# Patient Record
Sex: Female | Born: 1985 | Race: White | Hispanic: No | Marital: Single | Smoking: Former smoker
Health system: Southern US, Community
[De-identification: ages and names within clinical notes are randomized; demographics above are authoritative.]

## PROBLEM LIST (undated history)

## (undated) DIAGNOSIS — Q600 Renal agenesis, unilateral: Secondary | ICD-10-CM

## (undated) DIAGNOSIS — Q761 Klippel-Feil syndrome: Secondary | ICD-10-CM

## (undated) DIAGNOSIS — I519 Heart disease, unspecified: Secondary | ICD-10-CM

## (undated) DIAGNOSIS — O99419 Diseases of the circulatory system complicating pregnancy, unspecified trimester: Secondary | ICD-10-CM

## (undated) HISTORY — PX: NO PAST SURGERIES: SHX2092

---

## 2013-02-25 ENCOUNTER — Encounter (HOSPITAL_COMMUNITY): Payer: Self-pay | Admitting: Specialist

## 2013-03-09 ENCOUNTER — Ambulatory Visit (HOSPITAL_COMMUNITY): Payer: Self-pay

## 2013-03-16 ENCOUNTER — Ambulatory Visit (HOSPITAL_COMMUNITY)
Admission: RE | Admit: 2013-03-16 | Discharge: 2013-03-16 | Disposition: A | Discharge status: Home / Self Care | Payer: Medicaid Other | Source: Ambulatory Visit | Attending: Specialist | Admitting: Specialist

## 2013-03-16 ENCOUNTER — Encounter (HOSPITAL_COMMUNITY): Payer: Self-pay

## 2013-03-16 DIAGNOSIS — Q761 Klippel-Feil syndrome: Secondary | ICD-10-CM | POA: Insufficient documentation

## 2013-03-16 DIAGNOSIS — Q602 Renal agenesis, unspecified: Secondary | ICD-10-CM | POA: Insufficient documentation

## 2013-03-16 DIAGNOSIS — O352XX1 Maternal care for (suspected) hereditary disease in fetus, fetus 1: Secondary | ICD-10-CM

## 2013-03-16 DIAGNOSIS — Z981 Arthrodesis status: Secondary | ICD-10-CM | POA: Insufficient documentation

## 2013-03-16 DIAGNOSIS — O99891 Other specified diseases and conditions complicating pregnancy: Secondary | ICD-10-CM | POA: Insufficient documentation

## 2013-03-16 DIAGNOSIS — Q600 Renal agenesis, unilateral: Secondary | ICD-10-CM | POA: Insufficient documentation

## 2013-03-16 HISTORY — DX: Klippel-Feil syndrome: Q76.1

## 2013-03-16 HISTORY — DX: Renal agenesis, unilateral: Q60.0

## 2013-03-16 NOTE — Progress Notes (Addendum)
Genetic Counseling  High-Risk Gestation Note  Appointment Date:  03/16/2013 Referred By: Eliseo Squires, MD Date of Birth:  1985-12-06 Partner:  Andreas Blower   Pregnancy History: [id number] Estimated Date of Delivery: 09/19/13 Estimated Gestational Age: [redacted]w[redacted]d Attending: Rema Fendt, MD  Ms. Nicole Conrad was seen for genetic counseling because of a personal history of Klippel-Feil syndrome.    Ms. Nicole Conrad reported that she was diagnosed with Klippel-Feil syndrome during early childhood.  She is not certain who diagnosed her, but believes that it was a pediatrician in [place].  Ms. Nicole Conrad reported that she has never been evaluated by a medical geneticist and has never had genetic testing.  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  She also has congenital absence of a kidney. The contralateral kidney is normal.  Otherwise, she has no significant health or medical concerns.  Medical records were not available to verify the reported history.  Ms. Nicole Conrad was counseled that the risk assessment provided today is based on the reported history.  The patient was encouraged to contact me if she learns additional information regarding her condition.    Both family histories were reviewed and found to be otherwise noncontributory for birth defects, intellectual disability, and known genetic conditions. Without further information regarding the provided family history, an accurate genetic risk cannot be calculated. Further genetic counseling is warranted if more information is obtained.  Ms. Nicole Conrad was counseled that Klippel-Feil syndrome occurs in approximately 1 in 40,000 births and is characterized by the abnormal fusion of two or more bones in the neck.  Individuals with Klippel-Feil syndrome may have a short, webbed neck, decreased range of motion in the head and neck area, and/or a low hairline at the  back of the head.  Additionally, other features can be observed, including: facial asymmetry, hearing loss, cleft palate, a Sprengel anomaly (upward displacement of the scapula), and fusion in the thoracic spine causing scoliosis and/or kyphosis.  We discussed that Klippel-Feil syndrome varies in severity, with some individuals having many features and others having relatively few.  She was counseled that Klippel-Feil syndrome is most often a dominant condition, caused by a single gene alteration that can be inherited or occur de novo. We reviewed that Klippel-Feil syndrome less commonly occurs due to autosomal recessive inheritance (Klippel-Feil syndrome type 2).  Klippel-Feil syndrome shows genetic heterogeneity; to date, at least 2-3 genes, when altered, are known to cause Klippel-Feil syndrome.  Additionally, we discussed that Klippel-Feil anomaly (characterized by abnormal fusion of cervical vertebrae) can occur as a feature of another syndrome.  We reviewed autosomal dominant inheritance, where offspring of an affected individual have a 1 in 2 (50%) chance to inherit the condition. We also reviewed autosomal recessive inheritance, where offspring of an affected individual have a 1 in 4 (25%) chance to inherit the condition.  If Ms. Nicole Conrad has autosomal recessive Klippel-Feil syndrome (type 2), the risk of recurrence is expected to be low.  We discussed that all of her children would be expected to be carriers of the condition, but that the father of the baby would also have to be a carrier of the condition (same gene, same type) in order for the fetus to have an increased risk to inherit the condition.  If however, Ms. Nicole Conrad has autosomal dominant Klippel-Feil syndrome, the risk of recurrence is expected to be 50%.  She was counseled that if she  has autosomal dominant Klippel-Feil syndrome, she likely has a de novo gene change, given that neither of her parents have apparent clinical features of  Klippel-Feil syndrome.    She was counseled that prenatal genetic testing for the current pregnancy is not warranted at this time, considering that Ms. Nicole Conrad has never had genetic testing and a specific gene cause for her Klippel-Feil syndrome is not known.  We discussed the option of genetic testing for Ms. Nicole Conrad and/or a consultation with a medical geneticist.  She is not interested in these options at this time.  In addition, we discussed the availability of a detailed ultrasound at 18+ weeks.  She understands that ultrasound may detect severe fusion and shortening of the fetal neck; however mild cases may not be detectable by ultrasound.  A normal appearing fetal neck by ultrasound would be reassuring, but would not eliminate the possibility of Klippel-Feil syndrome in the fetus.  Ms. Nicole Conrad expressed interest in returning for a detailed ultrasound.  This appointment was scheduled today. She reported that she had a nuchal translucency ultrasound, which was wnl.  We again reviewed the limitations of ultrasound, especially in the first trimester.  Ms. Nicole Conrad was provided with written information regarding cystic fibrosis (CF) including the carrier frequency and incidence in the Caucasian population, the availability of carrier testing and prenatal diagnosis if indicated.  In addition, we discussed that CF is routinely screened for as part of the [place] newborn screening panel.  She declined testing today.   Ms. Nicole Conrad denied exposure to environmental toxins or chemical agents. She denied the use of alcohol, tobacco or street drugs. She denied significant viral illnesses during the course of her pregnancy. She has a complex pregnancy history, which was reviewed today during a maternal fetal medicine consult with Dr. Rema Fendt.  Please refer to Dr. Baltazar Apo note for specific pregnancy management recommendations.   I counseled Ms. Ms. Nicole Conrad regarding the above risks and available options.  The  approximate face-to-face time with the genetic counselor was 42 minutes.  Despina Arias, MS  Certified Genetic Counselor

## 2013-03-16 NOTE — Progress Notes (Signed)
MATERNAL FETAL MEDICINE CONSULT  Patient Name: Nicole Conrad Medical Record Number:  [id number] Date of Birth: 21-Jul-1985 Requesting Physician Name:  Eliseo Squires, MD Date of Service: 03/16/2013  Chief Complaint Maternal Klippel-Feil syndrome and poor obstetric history  History of Present Illness Nicole Conrad was seen today secondary to maternal Klippel-Feil syndrome and poor obstetric history at the request of Eliseo Squires, MD.  The patient is a 27 y.o. G4P0301 at 13 weeks 1 day by LMP who has Klippel-Feil syndrome.  She reports she has several fused cervical vertebrae, but is uncertain exam how many or which vertebrae.  She does not report any scoliosis.  Her main issue she has with regard to her skeletal malformations is intermittent sharp pains in her upper chest and shoulders.  She also has a congenital solitary kidney but normal overall renal function.  She has not cardiopulmonary issues.  She has not had any orthopedic surgery and does not have a regular doctor that monitors this issue.  I do not have medical records outlining the specifics of Nicole Conrad's skeletal malformations to confirm her account.  She suffered an IUFD at approximately 24 weeks due to what Nicole Conrad reports was an umbilical cord clot and subsequent underwent and induction of labor and had a vaginal delivery.  In her second pregnancy she delivered via NSVD at 36 weeks and had no complications other than the late preterm birth.  In her third pregnancy she was pregnant with twins and experienced PPROM at approximately 20 weeks and subsequently labored and had vaginal deliveries of both twins.  Nicole Conrad reports that no one else in her family has Klippel-Feil syndrome, including her living son and her second trimester losses.  Review of Systems Pertinent items are noted in HPI.  Patient History OB History  Gravida Para Term Preterm AB SAB TAB Ectopic Multiple Living  4 2  2 1          # Outcome Date GA Lbr Len/2nd Weight Sex  Delivery Anes PTL Lv  4 CUR           3 ABT  [redacted]w[redacted]d            Comments: PPROM with twins  2 PRE  [redacted]w[redacted]d    SVD     1 PRE  [redacted]w[redacted]d    SVD   SB     Comments: IUFD      Past Medical History  Diagnosis Date  . Klippel-Feil syndrome     Cervical spine fusion; scapula abnormality  . Solitary kidney, congenital   . Preterm labor     Past Surgical History  Procedure Laterality Date  . No past surgeries      History   Social History  . Marital Status: Single    Spouse Name: N/A    Number of Children: N/A  . Years of Education: N/A   Social History Main Topics  . Smoking status: Former Smoker -- 0.25 packs/day    Quit date: 03/09/2013  . Smokeless tobacco: None  . Alcohol Use: No  . Drug Use: No  . Sexual Activity: Yes    Birth Control/ Protection: None   Other Topics Concern  . None   Social History Narrative  . None    Family History Ms. Nicole Conrad has no family history of mental retardation, birth defects, or genetic diseases.   Assessment and Recommendations 1.  Maternal Klippel-Feil Syndrome.  This is a genetic condition that has several reported inheritance patterns.  After discussing the  matter with our genetic counselor, Despina Arias, [facility], Nicole Conrad likely has a new dominant mutation which would carry a 50% recurrence risk in her offspring.  For more details please see Nicole Conrad's note.  Nicole Conrad has opted not to purse genetic testing for Klippel-Feil syndrome either for herself or her fetus at this time.  She will return in 5 weeks for a detailed fetal anatomic survey, but understands that ultrasound has limited sensitivity at detecting Klippel-Feil syndrome in-utero.  Other than this recurrence risk the Klippel-Feil syndrome should have limited impact on her pregnancy as Nicole Conrad has very limited ongoing symptoms as a result of her skeletal malformations.  It is reassuring that she had no significant issues in her one successful pregnancy that she carried to  36 weeks.  As such I do not expect there to be any issues in this pregnancy.  People with Klippel-Feil sometimes have restrictive lung disease due to ribcage malformations, which can be worsened in pregnancy as the gravid uterus grows and displaces the diaphragm superiorly.  I have ordered a set of pulmonary function tests to determine Nicole Conrad's lung capacity. 2.  Poor obstetrical history.  Nicole Conrad has had two midtrimester pregnancy losses.  The first was reportedly due to an umbilical cord clot, but I do not have records available to confirm that.  She also experienced PPROM with twins in her third pregnancy.  I have ordered a thrombophilia work-up to investigate that as a possible etiology of her recurrent pregnancy losses.  However, her history is not typical for that diagnosis.  I would like to review the placental pathology report from this pregnancy to determine what if any thrombosis or infarction was present.  If the pathology was normal and the thrombophilia workup positive Nicole Conrad should receive prophylactic anticoagulation with either unfractionated heparin or low-molecular weight heparin.  A daily baby aspirin is also indicated if she has anti-phospholipid syndrome.  If the pathology report indications thrombosis or infarction Nicole Conrad should receive prophylactic heparin regardless of the thrombophilia results.  If the pathology was normal and no thrombophilia is present no anticoagulation is required.  We would be happy to see Nicole Conrad back in the [facility] to clarify the need for anti-coagulation once the blood tests have been completed and placental pathology reports obtained.  I spent 30 minutes with Nicole Conrad today of which 50% was face-to-face counseling.  Thank you for referring Nicole Conrad to the [facility].  Please do not hesitate to contact us with questions.   Rema Fendt, MD

## 2013-03-16 NOTE — ED Notes (Signed)
BP-128/72, P-93, Wt.-163lb

## 2013-03-17 LAB — FACTOR 5 LEIDEN

## 2013-03-17 LAB — LUPUS ANTICOAGULANT PANEL
Lupus Anticoagulant: NOT DETECTED
dRVVT Incubated 1:1 Mix: 36.5 secs (ref <42.9)

## 2013-03-17 LAB — BETA-2-GLYCOPROTEIN I ABS, IGG/M/A
Beta-2 Glyco I IgG: 0 G Units (ref <20)
Beta-2-Glycoprotein I IgA: 0 A Units (ref <20)

## 2013-03-17 LAB — CARDIOLIPIN ANTIBODIES, IGG, IGM, IGA: Anticardiolipin IgM: 29 MPL U/mL — ABNORMAL HIGH (ref <11)

## 2013-03-17 NOTE — Addendum Note (Signed)
Encounter addended by: Ty Hilts, RN on: 03/17/2013  8:39 AM<BR>     Documentation filed: Charges VN

## 2013-03-23 ENCOUNTER — Encounter (HOSPITAL_COMMUNITY): Payer: Medicaid Other

## 2013-04-20 ENCOUNTER — Other Ambulatory Visit (HOSPITAL_COMMUNITY): Payer: Self-pay | Admitting: Maternal and Fetal Medicine

## 2013-04-20 ENCOUNTER — Encounter (HOSPITAL_COMMUNITY): Payer: Self-pay

## 2013-04-20 ENCOUNTER — Ambulatory Visit (HOSPITAL_COMMUNITY)
Admission: RE | Admit: 2013-04-20 | Discharge: 2013-04-20 | Disposition: A | Discharge status: Home / Self Care | Payer: Medicaid Other | Source: Ambulatory Visit | Attending: Specialist | Admitting: Specialist

## 2013-04-20 DIAGNOSIS — O09299 Supervision of pregnancy with other poor reproductive or obstetric history, unspecified trimester: Secondary | ICD-10-CM | POA: Insufficient documentation

## 2013-04-20 DIAGNOSIS — O352XX0 Maternal care for (suspected) hereditary disease in fetus, not applicable or unspecified: Secondary | ICD-10-CM | POA: Insufficient documentation

## 2013-04-20 DIAGNOSIS — O352XX1 Maternal care for (suspected) hereditary disease in fetus, fetus 1: Secondary | ICD-10-CM

## 2013-04-20 DIAGNOSIS — O358XX0 Maternal care for other (suspected) fetal abnormality and damage, not applicable or unspecified: Secondary | ICD-10-CM | POA: Insufficient documentation

## 2013-04-20 DIAGNOSIS — Z8751 Personal history of pre-term labor: Secondary | ICD-10-CM | POA: Insufficient documentation

## 2013-04-20 DIAGNOSIS — Z1389 Encounter for screening for other disorder: Secondary | ICD-10-CM | POA: Insufficient documentation

## 2013-04-20 DIAGNOSIS — Z363 Encounter for antenatal screening for malformations: Secondary | ICD-10-CM | POA: Insufficient documentation

## 2014-02-23 ENCOUNTER — Encounter (HOSPITAL_COMMUNITY): Payer: Self-pay | Admitting: *Deleted

## 2014-04-06 ENCOUNTER — Emergency Department (HOSPITAL_COMMUNITY): Payer: Medicaid Other

## 2014-04-06 ENCOUNTER — Inpatient Hospital Stay (HOSPITAL_COMMUNITY)
Admission: EM | Admit: 2014-04-06 | Discharge: 2014-04-08 | DRG: 781 | Discharge status: Against Medical Advice | Payer: Medicaid Other | Attending: Cardiology | Admitting: Cardiology

## 2014-04-06 ENCOUNTER — Encounter (HOSPITAL_COMMUNITY): Payer: Self-pay | Admitting: Emergency Medicine

## 2014-04-06 DIAGNOSIS — R079 Chest pain, unspecified: Secondary | ICD-10-CM | POA: Diagnosis not present

## 2014-04-06 DIAGNOSIS — I379 Nonrheumatic pulmonary valve disorder, unspecified: Secondary | ICD-10-CM

## 2014-04-06 DIAGNOSIS — Z8249 Family history of ischemic heart disease and other diseases of the circulatory system: Secondary | ICD-10-CM | POA: Diagnosis not present

## 2014-04-06 DIAGNOSIS — Q600 Renal agenesis, unilateral: Secondary | ICD-10-CM | POA: Diagnosis not present

## 2014-04-06 DIAGNOSIS — Z3A17 17 weeks gestation of pregnancy: Secondary | ICD-10-CM | POA: Diagnosis present

## 2014-04-06 DIAGNOSIS — I214 Non-ST elevation (NSTEMI) myocardial infarction: Secondary | ICD-10-CM | POA: Diagnosis present

## 2014-04-06 DIAGNOSIS — E876 Hypokalemia: Secondary | ICD-10-CM | POA: Diagnosis present

## 2014-04-06 DIAGNOSIS — Z3A18 18 weeks gestation of pregnancy: Secondary | ICD-10-CM | POA: Diagnosis not present

## 2014-04-06 DIAGNOSIS — Q761 Klippel-Feil syndrome: Secondary | ICD-10-CM | POA: Diagnosis not present

## 2014-04-06 DIAGNOSIS — O2622 Pregnancy care for patient with recurrent pregnancy loss, second trimester: Secondary | ICD-10-CM | POA: Diagnosis present

## 2014-04-06 DIAGNOSIS — O99112 Other diseases of the blood and blood-forming organs and certain disorders involving the immune mechanism complicating pregnancy, second trimester: Secondary | ICD-10-CM | POA: Diagnosis not present

## 2014-04-06 DIAGNOSIS — I2542 Coronary artery dissection: Secondary | ICD-10-CM | POA: Diagnosis present

## 2014-04-06 DIAGNOSIS — O09292 Supervision of pregnancy with other poor reproductive or obstetric history, second trimester: Secondary | ICD-10-CM

## 2014-04-06 DIAGNOSIS — O99419 Diseases of the circulatory system complicating pregnancy, unspecified trimester: Secondary | ICD-10-CM

## 2014-04-06 DIAGNOSIS — O09299 Supervision of pregnancy with other poor reproductive or obstetric history, unspecified trimester: Secondary | ICD-10-CM | POA: Insufficient documentation

## 2014-04-06 DIAGNOSIS — O228X2 Other venous complications in pregnancy, second trimester: Secondary | ICD-10-CM | POA: Diagnosis not present

## 2014-04-06 DIAGNOSIS — D6861 Antiphospholipid syndrome: Secondary | ICD-10-CM | POA: Diagnosis not present

## 2014-04-06 DIAGNOSIS — Z87891 Personal history of nicotine dependence: Secondary | ICD-10-CM | POA: Diagnosis not present

## 2014-04-06 DIAGNOSIS — O26892 Other specified pregnancy related conditions, second trimester: Principal | ICD-10-CM | POA: Diagnosis present

## 2014-04-06 DIAGNOSIS — I519 Heart disease, unspecified: Secondary | ICD-10-CM

## 2014-04-06 HISTORY — DX: Diseases of the circulatory system complicating pregnancy, unspecified trimester: O99.419

## 2014-04-06 HISTORY — DX: Heart disease, unspecified: I51.9

## 2014-04-06 LAB — I-STAT CHEM 8, ED
BUN: 8 mg/dL (ref 6–23)
CALCIUM ION: 1.1 mmol/L — AB (ref 1.12–1.23)
Chloride: 105 mEq/L (ref 96–112)
Creatinine, Ser: 0.5 mg/dL (ref 0.50–1.10)
Glucose, Bld: 96 mg/dL (ref 70–99)
HEMATOCRIT: 35 % — AB (ref 36.0–46.0)
HEMOGLOBIN: 11.9 g/dL — AB (ref 12.0–15.0)
Potassium: 3.6 mEq/L — ABNORMAL LOW (ref 3.7–5.3)
SODIUM: 137 meq/L (ref 137–147)
TCO2: 19 mmol/L (ref 0–100)

## 2014-04-06 LAB — TROPONIN I
TROPONIN I: 12.27 ng/mL — AB (ref <0.30)
Troponin I: >20 ng/mL (ref <0.30)
Troponin I: >20 ng/mL (ref <0.30)

## 2014-04-06 LAB — CBC
HCT: 33 % — ABNORMAL LOW (ref 36.0–46.0)
Hemoglobin: 11.2 g/dL — ABNORMAL LOW (ref 12.0–15.0)
MCH: 31.2 pg (ref 26.0–34.0)
MCHC: 33.9 g/dL (ref 30.0–36.0)
MCV: 91.9 fL (ref 78.0–100.0)
PLATELETS: 181 10*3/uL (ref 150–400)
RBC: 3.59 MIL/uL — AB (ref 3.87–5.11)
RDW: 13.6 % (ref 11.5–15.5)
WBC: 15.7 10*3/uL — ABNORMAL HIGH (ref 4.0–10.5)

## 2014-04-06 LAB — COMPREHENSIVE METABOLIC PANEL
ALK PHOS: 80 U/L (ref 39–117)
ALT: 11 U/L (ref 0–35)
ANION GAP: 13 (ref 5–15)
AST: 52 U/L — ABNORMAL HIGH (ref 0–37)
Albumin: 2.8 g/dL — ABNORMAL LOW (ref 3.5–5.2)
BILIRUBIN TOTAL: 0.2 mg/dL — AB (ref 0.3–1.2)
BUN: 9 mg/dL (ref 6–23)
CO2: 20 meq/L (ref 19–32)
Calcium: 8.5 mg/dL (ref 8.4–10.5)
Chloride: 103 mEq/L (ref 96–112)
Creatinine, Ser: 0.53 mg/dL (ref 0.50–1.10)
GLUCOSE: 93 mg/dL (ref 70–99)
POTASSIUM: 3.8 meq/L (ref 3.7–5.3)
Sodium: 136 mEq/L — ABNORMAL LOW (ref 137–147)
TOTAL PROTEIN: 6.3 g/dL (ref 6.0–8.3)

## 2014-04-06 LAB — RAPID URINE DRUG SCREEN, HOSP PERFORMED
Amphetamines: NOT DETECTED
Barbiturates: NOT DETECTED
Benzodiazepines: NOT DETECTED
Cocaine: NOT DETECTED
OPIATES: NOT DETECTED
Tetrahydrocannabinol: NOT DETECTED

## 2014-04-06 LAB — TSH: TSH: 0.64 u[IU]/mL (ref 0.350–4.500)

## 2014-04-06 LAB — MRSA PCR SCREENING: MRSA by PCR: NEGATIVE

## 2014-04-06 LAB — I-STAT TROPONIN, ED: TROPONIN I, POC: 6.23 ng/mL — AB (ref 0.00–0.08)

## 2014-04-06 LAB — PRO B NATRIURETIC PEPTIDE: Pro B Natriuretic peptide (BNP): 181 pg/mL — ABNORMAL HIGH (ref 0–125)

## 2014-04-06 MED ORDER — NITROGLYCERIN 2 % TD OINT
1.0000 [in_us] | TOPICAL_OINTMENT | Freq: Four times a day (QID) | TRANSDERMAL | Status: DC
  Filled 2014-04-06: qty 30

## 2014-04-06 MED ORDER — ACETAMINOPHEN 325 MG PO TABS
650.0000 mg | ORAL_TABLET | ORAL | Status: DC | PRN
  Administered 2014-04-06 – 2014-04-08 (×5): 650 mg via ORAL
  Filled 2014-04-06 (×5): qty 2

## 2014-04-06 MED ORDER — METOPROLOL TARTRATE 12.5 MG HALF TABLET
12.5000 mg | ORAL_TABLET | Freq: Two times a day (BID) | ORAL | Status: DC
  Administered 2014-04-06 (×2): 12.5 mg via ORAL
  Filled 2014-04-06 (×4): qty 1

## 2014-04-06 MED ORDER — NITROGLYCERIN 0.4 MG SL SUBL
0.4000 mg | SUBLINGUAL_TABLET | SUBLINGUAL | Status: DC | PRN
  Administered 2014-04-07: 0.4 mg via SUBLINGUAL
  Filled 2014-04-06: qty 1

## 2014-04-06 MED ORDER — ASPIRIN EC 81 MG PO TBEC
81.0000 mg | DELAYED_RELEASE_TABLET | Freq: Every day | ORAL | Status: DC
  Administered 2014-04-07 – 2014-04-08 (×2): 81 mg via ORAL
  Filled 2014-04-06 (×2): qty 1

## 2014-04-06 MED ORDER — ONDANSETRON HCL 4 MG/2ML IJ SOLN: 4.0000 mg | Freq: Four times a day (QID) | INTRAMUSCULAR | Status: DC | PRN

## 2014-04-06 MED ORDER — POTASSIUM CHLORIDE CRYS ER 20 MEQ PO TBCR
40.0000 meq | EXTENDED_RELEASE_TABLET | Freq: Once | ORAL | Status: AC
  Administered 2014-04-06: 40 meq via ORAL
  Filled 2014-04-06: qty 2

## 2014-04-06 NOTE — ED Notes (Signed)
Pt is transferred [facility], pt's bloodwork showed an elevated troponin at 1.02. Pt was adm 4,0000 units of heparin, 1 inch nitroglycerin paste was applied to pt's central chest, pt also received 650 mg of tylenol. Pt denies chest pain upon arrival to department. Pt is A&O X4. Pt is [redacted] weeks pregnant. Pt denies complications with her pregnancy thus far.

## 2014-04-06 NOTE — Progress Notes (Signed)
  Echocardiogram 2D Echocardiogram has been performed.  Dyke Weible FRANCES 04/06/2014, 11:38 AM

## 2014-04-06 NOTE — ED Notes (Signed)
Pt is a transfer from [facility] for elevated troponin - pt admits to acute onset of sharp chest pain that began yesterday evening, admits to some mild intermittent dyspnea - pt denies n/v, diaphoresis, fever, cough or recent illness. Pt admits she is currently [redacted] wks pregnant as well.

## 2014-04-06 NOTE — ED Provider Notes (Addendum)
CSN: [id number]     Arrival date & time 04/06/14  4098 History   First MD Initiated Contact with Patient 04/06/14 0355     Chief Complaint  Patient presents with  . Chest Pain     (Consider location/radiation/quality/duration/timing/severity/associated sxs/prior Treatment) HPI Nicole Conrad is a 28 y.o. female with who is [redacted] weeks pregnant transferred from [facility] for chest pain. Patient states she had pain in her mid chest around 8 PM while preparing dinner for her children. This pain was sharp and pressure. It radiated to her left arm. She admits to shortness of breath as well. She had nausea, but no emesis or diaphoresis. She states her pain was worse with a deep breath. She was evaluated at [facility], there she had a troponin level of 1.0. Cardiology was consult it here who recommended patient be transferred for further evaluation. She was given nitro paste, and bolused 4000 units of heparin prior to transfer. Patient currently states her chest pain is much improved but still present. She's denying shortness of breath currently. She also states the left hand is still painful. Patient has no further complaints.  10 Systems reviewed and are negative for acute change except as noted in the HPI.     Past Medical History  Diagnosis Date  . Klippel-Feil syndrome     Cervical spine fusion; scapula abnormality  . Solitary kidney, congenital   . Preterm labor    Past Surgical History  Procedure Laterality Date  . No past surgeries     Family History  Problem Relation Age of Onset  . CAD Mother 92   History  Substance Use Topics  . Smoking status: Former Smoker -- 0.25 packs/day    Quit date: 03/09/2013  . Smokeless tobacco: Not on file  . Alcohol Use: No   OB History   Grav Para Term Preterm Abortions TAB SAB Ect Mult Living   4 2  2 1     1      Review of Systems    Allergies  Review of patient's allergies indicates no known allergies.  Home Medications   Prior to  Admission medications   Medication Sig Start Date End Date Taking? Authorizing Provider  aspirin 81 MG chewable tablet Chew 81 mg by mouth daily.    Historical Provider, MD  Prenatal Vit-Fe Fumarate-FA (PRENATAL MULTIVITAMIN) TABS tablet Take 1 tablet by mouth daily at 12 noon.    Historical Provider, MD   BP 118/70  Pulse 75  Temp(Src) 98.3 F (36.8 C) (Oral)  Resp 20  Ht 5' (1.524 m)  Wt 190 lb (86.183 kg)  BMI 37.11 kg/m2  SpO2 96% Physical Exam  Nursing note and vitals reviewed. Constitutional: She is oriented to person, place, and time. She appears well-developed and well-nourished. No distress.  HENT:  Head: Normocephalic and atraumatic.  Nose: Nose normal.  Mouth/Throat: Oropharynx is clear and moist. No oropharyngeal exudate.  Eyes: Conjunctivae and EOM are normal. Pupils are equal, round, and reactive to light. No scleral icterus.  Neck: Normal range of motion. Neck supple. No JVD present. No tracheal deviation present. No thyromegaly present.  Cardiovascular: Normal rate, regular rhythm and normal heart sounds.  Exam reveals no gallop and no friction rub.   No murmur heard. Pulmonary/Chest: Effort normal and breath sounds normal. No respiratory distress. She has no wheezes. She exhibits no tenderness.  Abdominal: Soft. Bowel sounds are normal. She exhibits no distension and no mass. There is no tenderness. There is no rebound and  no guarding.  Gravid  Musculoskeletal: Normal range of motion. She exhibits no edema and no tenderness.  Lymphadenopathy:    She has no cervical adenopathy.  Neurological: She is alert and oriented to person, place, and time.  Skin: Skin is warm and dry. No rash noted. She is not diaphoretic. No erythema. No pallor.    ED Course  Procedures (including critical care time) Labs Review Labs Reviewed  COMPREHENSIVE METABOLIC PANEL - Abnormal; Notable for the following:    Sodium 136 (*)    Albumin 2.8 (*)    AST 52 (*)    Total Bilirubin 0.2  (*)    All other components within normal limits  PRO B NATRIURETIC PEPTIDE - Abnormal; Notable for the following:    Pro B Natriuretic peptide (BNP) 181.0 (*)    All other components within normal limits  CBC - Abnormal; Notable for the following:    WBC 15.7 (*)    RBC 3.59 (*)    Hemoglobin 11.2 (*)    HCT 33.0 (*)    All other components within normal limits  I-STAT TROPOININ, ED - Abnormal; Notable for the following:    Troponin i, poc 6.23 (*)    All other components within normal limits  I-STAT CHEM 8, ED - Abnormal; Notable for the following:    Potassium 3.6 (*)    Calcium, Ion 1.10 (*)    Hemoglobin 11.9 (*)    HCT 35.0 (*)    All other components within normal limits    Imaging Review Dg Chest 2 View  04/06/2014   CLINICAL DATA:  Chest pain and shortness of breath. Seventeen weeks pregnant. Shielded.  EXAM: CHEST  2 VIEW  COMPARISON:  None.  FINDINGS: Prominent thoracic scoliosis and kyphosis centered at the upper thoracic region. Normal heart size and pulmonary vascularity. No focal airspace disease or consolidation in the lungs. No blunting of costophrenic angles. No pneumothorax. Mediastinal contours appear intact.  IMPRESSION: No active cardiopulmonary disease.  Upper thoracic kyphoscoliosis.   Electronically Signed   By: Burman NievesWilliam  Stevens M.D.   On: 04/06/2014 04:21     EKG Interpretation   Date/Time:  Wednesday April 06 2014 03:36:41 EDT Ventricular Rate:  76 PR Interval:  113 QRS Duration: 92 QT Interval:  384 QTC Calculation: 432 R Axis:   29 Text Interpretation:  Sinus rhythm T wave inversion Inferior leads  Abnormal ekg Confirmed by Erroll Lunani, Hideo Googe Ayokunle [phone]([id number]) on 04/06/2014  4:50:15 AM      MDM   Final diagnoses:  Chest pain, unspecified chest pain type    Patient presents to the emergency department out of concern for chest pain.  Her story does concern for ACS versus PE. Her troponins have risen since leaving [facility]. It is now 6 from 1.  Cardiology was paged for admission and will evaluate patient's bedside. Patient safe and nitroglycerin didn't help her pain. Bedside echo did not reveal distended RV.  Fetal heart per nursing staff is in the 150s.    Tomasita CrumbleAdeleke Len Kluver, MD 04/06/14 [phone]  Tomasita CrumbleAdeleke Aj Crunkleton, MD 04/06/14 [phone]

## 2014-04-06 NOTE — Consult Note (Addendum)
[facility] Faculty Practice OB/GYN Attending Consult Note   Consult Date: 04/06/2014  Reason for Consult: Myocardial infarction in the setting of second trimester pregnancy Referring Physician: Dr Stanford Breed, Cardiology    Assessment/Plan: 1.  S/p Inferior Myocardial Infarction 2.  15 5/7 week IUP 3.  28yo [id number]  4.  Multiple pregnancy loss 5.  Short interval between pregnancy.  Defer management to the primary team.  Aspirin 09WJ is certainly safe in pregnancy, particularly as it is used in many instances to reduce the risk of preeclampsia.  While Labetalol is usually preferred over Metoprolol during pregnancy.  However, Metoprolol carries similar risk profile and is acceptable for use in pregnancy, particularly if Metoprolol is preferred over Labetalol in treating the maternal condition.  Regardless of the beta-blocker, serial growth [place] should be done starting at 20 weeks to screen for IUGR.   As recommended by radiology, cardiac CT should pose minimal risk to the fetus, particularly as she is approximately 15 weeks in gestation.  If the CT shows a thrombus instead of a dissection, certainly heparin or LMWH, such as Lovenox, would be safe in pregnancy.  I would recommend assessment of fetal heart tones daily by doppler and obtaining a UDS to rule out an substance abuse that may be contributing to her primary problem.  Appreciate care of [place] by her primary team  Will continue daily rounding and daily FHR assessment.  Please call [phone] [facility] Attending on call) for any obstetric concerns at any time.  Thank you for involving [place] in the care of this patient.   History: Nicole Conrad is an 28 y.o. [id number] female at 97 5/7 weeks  with an EDC of 09/02/14 as reported by the patient.  She was admitted for Inferior myocardial infarction with sudden onset of severe chest pain last night.  With elevated troponins and EKG changes, she was given a heparin bolus and  transferred from [place] to [facility].  She is currently pain free.    In terms of obstetrical symptoms, her last pregnancy ended approximately 7 months ago.  She has a history of 2 losses during pregnancy, the first due to blood clot.  She saw Dr Herbie Saxon of MFM on 9/9, who ordered thrombophilia work up, which was negative.  She currently reports no discharge, leaking fluid, vaginal bleeding, headache, nausea, vomiting.    Pertinent OB/GYN History:  OB History  Gravida Para Term Preterm AB SAB TAB Ectopic Multiple Living  5 3 0 3 1 0 0 0 0 2     # Outcome Date GA Lbr Len/2nd Weight Sex Delivery Anes PTL Lv  5 CUR           4 ABT  [redacted]w[redacted]d           Comments: PPROM with twins  3 PRE  322w0d  SVD   Y  2 PRE  3679w0d SVD   Y  1 PRE  24w31w0dSVD   SB     Comments: IUFD      No LMP recorded. Patient is pregnant.  Patient Active Problem List   Diagnosis Date Noted  . NSTEMI (non-ST elevated myocardial infarction) 04/06/2014  . Myocardial Infarction (NSTEMI) at [redacted] weeks gestation 04/06/2014  . Pain in the chest 04/06/2014  . Klippel-Feil syndrome   . Solitary kidney, congenital     Past Medical History  Diagnosis Date  .  Klippel-Feil syndrome     Cervical spine fusion; scapula abnormality  . Solitary kidney, congenital   . Preterm labor   . NSTEMI at [redacted] weeks gestation affecting pregnancy 04/06/2014    Admitted to [facility]. Follow by Dr.McLeod in [place]; [facility] consulted during [facility] admission     Past Surgical History  Procedure Laterality Date  . No past surgeries      Family History  Problem Relation Age of Onset  . CAD Mother 58    Social History:  reports that she quit smoking about 12 months ago. She does not have any smokeless tobacco history on file. She reports that she does not drink alcohol or use illicit drugs.  Allergies: No Known Allergies  Medications: I have reviewed the patient's current medications.  Review of Systems: Pertinent items are  noted in HPI.  Focused Physical Examination BP 131/77  Pulse 74  Temp(Src) 97.8 F (36.6 C) (Oral)  Resp 24  Ht 5' (1.524 m)  Wt 86 kg (189 lb 9.5 oz)  BMI 37.03 kg/m2  SpO2 99%  Breastfeeding? No GENERAL: Well-developed, well-nourished female in no acute distress.  HEART: regular rate, no murmur LUNGS: clear to auscultation bilaterally, no wheezing. ABDOMEN: Soft, nontender, nondistended. No organomegaly.  Fundus approximately 3cm below umbilicus. PELVIC: Deferred.  EXTREMITIES: No cyanosis, clubbing, or edema, 2+ distal pulses.  Results for orders placed during the hospital encounter of 04/06/14 (from the past 72 hour(s))  COMPREHENSIVE METABOLIC PANEL     Status: Abnormal   Collection Time    04/06/14  3:40 AM      Result Value Ref Range   Sodium 136 (*) 137 - 147 mEq/L   Potassium 3.8  3.7 - 5.3 mEq/L   Chloride 103  96 - 112 mEq/L   CO2 20  19 - 32 mEq/L   Glucose, Bld 93  70 - 99 mg/dL   BUN 9  6 - 23 mg/dL   Creatinine, Ser 0.53  0.50 - 1.10 mg/dL   Calcium 8.5  8.4 - 10.5 mg/dL   Total Protein 6.3  6.0 - 8.3 g/dL   Albumin 2.8 (*) 3.5 - 5.2 g/dL   AST 52 (*) 0 - 37 U/L   ALT 11  0 - 35 U/L   Alkaline Phosphatase 80  39 - 117 U/L   Total Bilirubin 0.2 (*) 0.3 - 1.2 mg/dL   GFR calc non Af Amer >90  >90 mL/min   GFR calc Af Amer >90  >90 mL/min   Comment: (NOTE)     The eGFR has been calculated using the CKD EPI equation.     This calculation has not been validated in all clinical situations.     eGFR's persistently <90 mL/min signify possible Chronic Kidney     Disease.   Anion gap 13  5 - 15  PRO B NATRIURETIC PEPTIDE     Status: Abnormal   Collection Time    04/06/14  3:40 AM      Result Value Ref Range   Pro B Natriuretic peptide (BNP) 181.0 (*) 0 - 125 pg/mL  CBC     Status: Abnormal   Collection Time    04/06/14  3:40 AM      Result Value Ref Range   WBC 15.7 (*) 4.0 - 10.5 K/uL   RBC 3.59 (*) 3.87 - 5.11 MIL/uL   Hemoglobin 11.2 (*) 12.0 - 15.0  g/dL   HCT 33.0 (*) 36.0 - 46.0 %  MCV 91.9  78.0 - 100.0 fL   MCH 31.2  26.0 - 34.0 pg   MCHC 33.9  30.0 - 36.0 g/dL   RDW 13.6  11.5 - 15.5 %   Platelets 181  150 - 400 K/uL  I-STAT TROPOININ, ED     Status: Abnormal   Collection Time    04/06/14  3:41 AM      Result Value Ref Range   Troponin i, poc 6.23 (*) 0.00 - 0.08 ng/mL   Comment NOTIFIED PHYSICIAN     Comment 3            Comment: Due to the release kinetics of cTnI,     a negative result within the first hours     of the onset of symptoms does not rule out     myocardial infarction with certainty.     If myocardial infarction is still suspected,     repeat the test at appropriate intervals.  I-STAT CHEM 8, ED     Status: Abnormal   Collection Time    04/06/14  3:43 AM      Result Value Ref Range   Sodium 137  137 - 147 mEq/L   Potassium 3.6 (*) 3.7 - 5.3 mEq/L   Chloride 105  96 - 112 mEq/L   BUN 8  6 - 23 mg/dL   Creatinine, Ser 0.50  0.50 - 1.10 mg/dL   Glucose, Bld 96  70 - 99 mg/dL   Calcium, Ion 1.10 (*) 1.12 - 1.23 mmol/L   TCO2 19  0 - 100 mmol/L   Hemoglobin 11.9 (*) 12.0 - 15.0 g/dL   HCT 35.0 (*) 36.0 - 46.0 %  MRSA PCR SCREENING     Status: None   Collection Time    04/06/14  6:13 AM      Result Value Ref Range   MRSA by PCR NEGATIVE  NEGATIVE   Comment:            The GeneXpert MRSA Assay (FDA     approved for NASAL specimens     only), is one component of a     comprehensive MRSA colonization     surveillance program. It is not     intended to diagnose MRSA     infection nor to guide or     monitor treatment for     MRSA infections.  TROPONIN I     Status: Abnormal   Collection Time    04/06/14  7:06 AM      Result Value Ref Range   Troponin I >20.00 (*) <0.30 ng/mL   Comment:            Due to the release kinetics of cTnI,     a negative result within the first hours     of the onset of symptoms does not rule out     myocardial infarction with certainty.     If myocardial infarction  is still suspected,     repeat the test at appropriate intervals.     CRITICAL RESULT CALLED TO, READ BACK BY AND VERIFIED WITH:     T.PENNINGTON,RN [id number] 04/06/14 CLARK,S  TSH     Status: None   Collection Time    04/06/14  7:06 AM      Result Value Ref Range   TSH 0.640  0.350 - 4.500 uIU/mL     Dg Chest 2 View  04/06/2014   CLINICAL DATA:  Chest pain and shortness  of breath. Seventeen weeks pregnant. Shielded.  EXAM: CHEST  2 VIEW  COMPARISON:  None.  FINDINGS: Prominent thoracic scoliosis and kyphosis centered at the upper thoracic region. Normal heart size and pulmonary vascularity. No focal airspace disease or consolidation in the lungs. No blunting of costophrenic angles. No pneumothorax. Mediastinal contours appear intact.  IMPRESSION: No active cardiopulmonary disease.  Upper thoracic kyphoscoliosis.   Electronically Signed   By: Lucienne Capers M.D.   On: 04/06/2014 04:21     Loma Boston, DO Attending Physician  Faculty Practice, [facility]

## 2014-04-06 NOTE — Progress Notes (Signed)
Utilization Review Completed.Nicole Conrad T9/30/2015  

## 2014-04-06 NOTE — H&P (Signed)
CARDIOLOGY ADMISSION NOTE  Patient ID: Nicole Conrad MRN: [id number] DOB/AGE: [birth year] 30 y.o.  Admit date: 04/06/2014 Primary Physician   None Primary Cardiologist   None Chief Complaint    Chest pain  HPI:  The patient is pregnant and in her 17th week. The patient presented to [facility] ED with chest pain starting at 8 pm last night.  It was sudden and severe (8/10).  There was some neck discomfort and left arm pain. She did say she had some pain with breathing and some SOB with nausea but no vomiting.  She was initially treated with tylenol with improvement.  The first EKG at 23:02 demonstrated questionable mild inferior ST elevation.  However her initial troponin was 1.02.  She did have return of her pain and follow up EKG at 0136 demonstrated T wave inversion in the inferior leads.  Dr. Eldridge Dace was called by the ED MD.  The on call OB/GYN for [facility] was called as well and the patient was cleared for a heparin bolus, ASA and NTG paste.  On transfer to [facility] ED she is pain free.  EKG does show T wave inversion that has evolved somewhat in the inferior leads.  However, there is no acute ST elevation.  POC troponin was 6.23.  ProBNP was 181.  Bedside echo suggests perhaps a mild inferior wall motion abnormality but overall preserved EF.  She reports that prior to last night she was feeling well.  She has two children at home with the youngest being 37 months old.  She had no trouble with this pregnancy.  She did have three prior miscarriages. The first was at 8.5 months (she is not clear why).  The second was twins at 5 months.     Past Medical History  Diagnosis Date  . Klippel-Feil syndrome     Cervical spine fusion; scapula abnormality  . Solitary kidney, congenital   . Preterm labor     Past Surgical History  Procedure Laterality Date  . No past surgeries      No Known Allergies No current facility-administered medications on file prior to encounter.   Current Outpatient  Prescriptions on File Prior to Encounter  Medication Sig Dispense Refill  . aspirin 81 MG chewable tablet Chew 81 mg by mouth daily.      . Prenatal Vit-Fe Fumarate-FA (PRENATAL MULTIVITAMIN) TABS tablet Take 1 tablet by mouth daily at 12 noon.       History   Social History  . Marital Status: Single    Spouse Name: N/A    Number of Children: 2  . Years of Education: N/A   Occupational History  . Not on file.   Social History Main Topics  . Smoking status: Former Smoker -- 0.25 packs/day    Quit date: 03/09/2013  . Smokeless tobacco: Not on file  . Alcohol Use: No  . Drug Use: No  . Sexual Activity: Yes    Birth Control/ Protection: None   Other Topics Concern  . Not on file   Social History Narrative  . No narrative on file    Family History  Problem Relation Age of Onset  . CAD Mother 95     ROS:  Otherwise as stated in the HPI and negative for all other systems.  Physical Exam: Blood pressure 118/70, pulse 75, temperature 98.3 F (36.8 C), temperature source Oral, resp. rate 20, height 5' (1.524 m), weight 190 lb (86.183 kg), SpO2 96.00%, unknown if currently breastfeeding.  GENERAL:  Well appearing HEENT:  Pupils equal round and reactive, fundi not visualized, oral mucosa unremarkable NECK:  No jugular venous distention, waveform within normal limits, carotid upstroke brisk and symmetric, no bruits, no thyromegaly LYMPHATICS:  No cervical, inguinal adenopathy LUNGS:  Clear to auscultation bilaterally BACK:  No CVA tenderness CHEST:  Unremarkable HEART:  PMI not displaced or sustained,S1 and S2 within normal limits, no S3, no S4, no clicks, no rubs, no murmurs ABD:  Flat, positive bowel sounds normal in frequency in pitch, no bruits, no rebound, no guarding, no midline pulsatile mass, no hepatomegaly, no splenomegaly EXT:  2 plus pulses throughout, no edema, no cyanosis no clubbing SKIN:  No rashes no nodules NEURO:  Cranial nerves II through XII grossly intact,  motor grossly intact throughout PSYCH:  Cognitively intact, oriented to person place and time  Labs: Lab Results  Component Value Date   BUN 8 04/06/2014   Lab Results  Component Value Date   CREATININE 0.50 04/06/2014   Lab Results  Component Value Date   NA 137 04/06/2014   K 3.6* 04/06/2014   CL 105 04/06/2014   CO2 20 04/06/2014   No results found for this basename: TROPONINI   Lab Results  Component Value Date   WBC 15.7* 04/06/2014   HGB 11.9* 04/06/2014   HCT 35.0* 04/06/2014   MCV 91.9 04/06/2014   PLT 181 04/06/2014     Radiology:  CXR:  Prominent thoracic scoliosis and kyphosis centered at the upper  thoracic region. Normal heart size and pulmonary vascularity. No  focal airspace disease or consolidation in the lungs. No blunting of  costophrenic angles. No pneumothorax. Mediastinal contours appear  intact.   EKG:  NSR, rate 76, axis WNL, intervals WNL, no acute ST T wave changes.  04/06/2014  ASSESSMENT AND PLAN:    NQWMI:  I suspect an inferior MI.  However, given the fact that there is not currently ST elevation and that she is pain free and [redacted] weeks pregnant I agree with conservative therapy currently.  I will continue ASA and NTG paste.  Heparin if she has recurrent pain.  Check a full echo this AM.  If she remains pain free we could consider further imaging (cath vs risk stratification with stress perfusion study.)  We should involve high risk OB with consultation in the AM.    ELEVATED WBC:  Likely related to number one above  PREGNANCY:  As above   Signed: Rollene RotundaJames Desirai Traxler 04/06/2014, 4:53 AM

## 2014-04-06 NOTE — Progress Notes (Signed)
Fetal Heart tones=157BPM

## 2014-04-06 NOTE — Progress Notes (Addendum)
    Subjective:  Denies CP or dyspnea   Objective:  Filed Vitals:   04/06/14 0700 04/06/14 0715 04/06/14 0730 04/06/14 0800  BP: 100/59 114/65 127/64 128/64  Pulse: 77 75 71   Temp:    98.2 F (36.8 C)  TempSrc:    Oral  Resp: 23 25 24 28   Height:      Weight:      SpO2: 99% 99% 99% 99%    Intake/Output from previous day: No intake or output data in the 24 hours ending 04/06/14 0905  Physical Exam: Physical exam: Well-developed well-nourished in no acute distress.  Skin is warm and dry.  HEENT is normal.  Neck is supple. Chest is clear to auscultation with normal expansion.  Cardiovascular exam is regular rate and rhythm.  Abdominal exam nontender or distended. No masses palpated. Intrauterine pregnancy Extremities show no edema. neuro grossly intact    Lab Results: Basic Metabolic Panel:  Recent Labs  16/04/9608/30/15 0340 04/06/14 0343  NA 136* 137  K 3.8 3.6*  CL 103 105  CO2 20  --   GLUCOSE 93 96  BUN 9 8  CREATININE 0.53 0.50  CALCIUM 8.5  --    CBC:  Recent Labs  04/06/14 0340 04/06/14 0343  WBC 15.7*  --   HGB 11.2* 11.9*  HCT 33.0* 35.0*  MCV 91.9  --   PLT 181  --    Cardiac Enzymes:  Recent Labs  04/06/14 0706  TROPONINI >20.00*     Assessment/Plan:  1 status post inferior myocardial infarction-the patient presented with chest pain that started at approximately 8 PM last night. She is now pain-free. Her event therefore occurred over 12 hours ago. I have discussed the patient with multiple physicians including Drs Elyn PeersMcalhany, Cooper, Riley KillStuckey, and AccokeekMclean. I also discussed with Dr Molli PoseyMansell radiology. Difficult situation. Her infarct is most likely related to a coronary dissection. Most commonly this would be the distal LAD (given ECG changes, RCA cannot be excluded). Given late presentation and the fact that she is clinically stable this morning with no chest pain we do not feel cardiac catheterization is indicated. We'll treat with aspirin and  low-dose metoprolol. We will plan to proceed with a cardiac CT tomorrow morning to define anatomy. If indeed this is a distal LAD dissection I would be concerned about the possibility of further dissection proximally. I discussed the risk of radiation to the fetus with Dr. Molli PoseyMansell. We feel the risk is minimal given that she is second trimester, we are scanning the chest and a lead apron will be used. I explained the minimal risk to the patient and she agrees to proceed tomorrow. I also do not know what the risk of recurrent dissection is as her pregnancy progresses. I have consulted high risk OB for their input. Plan echocardiogram to assess LV function. DC nitropaste as she is having headache. 2 17 week intrauterine pregnancy-OB to see. 3 hypokalemia-supplement  Nicole Conrad 04/06/2014, 9:05 AM   1 hour additional physician charge 9:00 AM to 10:00 AM. Nicole Conrad

## 2014-04-07 ENCOUNTER — Inpatient Hospital Stay (HOSPITAL_COMMUNITY): Payer: Medicaid Other

## 2014-04-07 DIAGNOSIS — R079 Chest pain, unspecified: Secondary | ICD-10-CM

## 2014-04-07 DIAGNOSIS — I214 Non-ST elevation (NSTEMI) myocardial infarction: Secondary | ICD-10-CM

## 2014-04-07 DIAGNOSIS — O26899 Other specified pregnancy related conditions, unspecified trimester: Secondary | ICD-10-CM

## 2014-04-07 LAB — BASIC METABOLIC PANEL
Anion gap: 14 (ref 5–15)
BUN: 10 mg/dL (ref 6–23)
CHLORIDE: 103 meq/L (ref 96–112)
CO2: 21 mEq/L (ref 19–32)
Calcium: 8.3 mg/dL — ABNORMAL LOW (ref 8.4–10.5)
Creatinine, Ser: 0.6 mg/dL (ref 0.50–1.10)
GFR calc non Af Amer: >90 mL/min (ref >90)
Glucose, Bld: 84 mg/dL (ref 70–99)
Potassium: 4.1 mEq/L (ref 3.7–5.3)
Sodium: 138 mEq/L (ref 137–147)

## 2014-04-07 LAB — CBC
HCT: 32.8 % — ABNORMAL LOW (ref 36.0–46.0)
Hemoglobin: 10.9 g/dL — ABNORMAL LOW (ref 12.0–15.0)
MCH: 30.4 pg (ref 26.0–34.0)
MCHC: 33.2 g/dL (ref 30.0–36.0)
MCV: 91.6 fL (ref 78.0–100.0)
Platelets: 196 10*3/uL (ref 150–400)
RBC: 3.58 MIL/uL — AB (ref 3.87–5.11)
RDW: 13.9 % (ref 11.5–15.5)
WBC: 13.3 10*3/uL — AB (ref 4.0–10.5)

## 2014-04-07 LAB — LIPID PANEL
CHOL/HDL RATIO: 2.5 ratio
CHOLESTEROL: 136 mg/dL (ref 0–200)
HDL: 54 mg/dL (ref >39)
LDL Cholesterol: 54 mg/dL (ref 0–99)
Triglycerides: 141 mg/dL (ref <150)
VLDL: 28 mg/dL (ref 0–40)

## 2014-04-07 LAB — GLUCOSE, CAPILLARY: Glucose-Capillary: 89 mg/dL (ref 70–99)

## 2014-04-07 MED ORDER — METOPROLOL TARTRATE 25 MG PO TABS
25.0000 mg | ORAL_TABLET | Freq: Two times a day (BID) | ORAL | Status: DC
  Administered 2014-04-07 – 2014-04-08 (×3): 25 mg via ORAL
  Filled 2014-04-07 (×4): qty 1

## 2014-04-07 MED ORDER — METOPROLOL TARTRATE 25 MG PO TABS
25.0000 mg | ORAL_TABLET | ORAL | Status: AC
  Administered 2014-04-07: 25 mg via ORAL
  Filled 2014-04-07: qty 1

## 2014-04-07 MED ORDER — IOHEXOL 350 MG/ML SOLN
80.0000 mL | Freq: Once | INTRAVENOUS | Status: AC | PRN
  Administered 2014-04-07: 80 mL via INTRAVENOUS

## 2014-04-07 MED ORDER — METOPROLOL TARTRATE 25 MG PO TABS
25.0000 mg | ORAL_TABLET | Freq: Once | ORAL | Status: AC
  Administered 2014-04-07: 25 mg via ORAL

## 2014-04-07 NOTE — Progress Notes (Signed)
Report given to receiving nurse, Delice Bisonara, RN. Pt transferred to room 2W08 with belongings, medications, and chart.  Pt VSS and no complaints at this time.   Dawson BillsKim Benita Boonstra, RN

## 2014-04-07 NOTE — Progress Notes (Signed)
Subjective: Patient reports no chest pain or abdominal pain.she is very concerned about kids, who she is having trouble locating.  She is tearful and asking to leave the hospital urgently to try to find them.  Objective: I have reviewed patient's vital signs, intake and output, medications and radiology results.  General: alert, cooperative and appears stated age GI: soft, NT +FHR by doppler   Assessment/Plan: Pregnancy-doing well--ongoing prenatal care in [place].  She has a poor OB history including an IUFD in the second trimester and PPROM with delivery of twins in the second trimester.  She underwent genetic counseling for her h/o  Klippel-Feil Syndrome which is inherited in an autosomal dominant fashion in 2014.  She did have thrombophilia work-up at that time which included a moderately positive IgM anti-cardiolipin Ab, but not a high positive.   NSTEMI---if there is evidence of clot and not dissection, given possibility of thrombophilia would consider repeating labs and long term anti-coagulation for pregnancy as well as for her heart issues.  We will continue to follow along.  LOS: 1 day   Please call [phone] with questions and concerns. Ike Maragh S 04/07/2014, 11:52 AM

## 2014-04-07 NOTE — Progress Notes (Signed)
Pt received into 2W08 with belongings. Pt hooked to monitor and CCMD notified. HR anywhere from 69-88 SR. BP 108/63. MD paged. Orders received to give 25mg  Lopressor PO once in preparation for CT. Will carry out orders and continue to monitor pt closely.

## 2014-04-07 NOTE — Progress Notes (Signed)
    Subjective:  Denies CP or dyspnea   Objective:  Filed Vitals:   04/06/14 1949 04/06/14 2116 04/06/14 2336 04/07/14 0358  BP: 130/56 128/66 104/53 114/57  Pulse: 85 89 77 78  Temp: 98.3 F (36.8 C)  98.2 F (36.8 C) 98.4 F (36.9 C)  TempSrc: Oral  Oral Oral  Resp: 28 20 30 25   Height:      Weight:      SpO2: 97% 97% 96% 96%    Intake/Output from previous day:  Intake/Output Summary (Last 24 hours) at 04/07/14 0720 Last data filed at 04/06/14 2030  Gross per 24 hour  Intake   1060 ml  Output    575 ml  Net    485 ml    Physical Exam: Physical exam: Well-developed well-nourished in no acute distress.  Skin is warm and dry.  HEENT is normal.  Neck is supple. Chest is clear to auscultation with normal expansion.  Cardiovascular exam is regular rate and rhythm.  Abdominal exam nontender or distended. No masses palpated. Intrauterine pregnancy Extremities show no edema. neuro grossly intact    Lab Results: Basic Metabolic Panel:  Recent Labs  86/57/8409/30/15 0340 04/06/14 0343 04/07/14 0354  NA 136* 137 138  K 3.8 3.6* 4.1  CL 103 105 103  CO2 20  --  21  GLUCOSE 93 96 84  BUN 9 8 10   CREATININE 0.53 0.50 0.60  CALCIUM 8.5  --  8.3*   CBC:  Recent Labs  04/06/14 0340 04/06/14 0343 04/07/14 0354  WBC 15.7*  --  13.3*  HGB 11.2* 11.9* 10.9*  HCT 33.0* 35.0* 32.8*  MCV 91.9  --  91.6  PLT 181  --  196   Cardiac Enzymes:  Recent Labs  04/06/14 0706 04/06/14 1203 04/06/14 1853  TROPONINI >20.00* >20.00* 12.27*     Assessment/Plan:  1 status post inferior myocardial infarction-Patient remains pain-free. I previously discussed patient with multiple physicians including Drs Elyn PeersMcalhany, Cooper, Riley KillStuckey, and LoganMclean. I also discussed with Dr Molli PoseyMansell radiology. Her infarct is most likely related to a coronary dissection. Most commonly this would be the distal LAD (given ECG changes, RCA cannot be excluded). Continue aspirin and low-dose metoprolol  (increase to 25 mg po BID). We will plan to proceed with a cardiac CT to define anatomy. If indeed this is a distal LAD dissection I would be concerned about the possibility of further dissection proximally. If no other CAD demonstrated on CT, will plan medical therapy with potential DC Sat AM. I discussed the risk of radiation to the fetus with Dr. Molli PoseyMansell. We feel the risk is minimal given that she is second trimester, we are scanning the chest and a lead apron will be used. I explained the minimal risk to the patient and she agrees to proceed. I also do not know what the risk of recurrent dissection is as her pregnancy progresses. OB following. Echo shows normal LV function 2 17 week intrauterine pregnancy-following 3 hypokalemia-improved  Olga MillersBrian Shanigua Gibb 04/07/2014, 7:20 AM

## 2014-04-08 DIAGNOSIS — O99112 Other diseases of the blood and blood-forming organs and certain disorders involving the immune mechanism complicating pregnancy, second trimester: Secondary | ICD-10-CM

## 2014-04-08 DIAGNOSIS — O228X2 Other venous complications in pregnancy, second trimester: Secondary | ICD-10-CM

## 2014-04-08 DIAGNOSIS — D6861 Antiphospholipid syndrome: Secondary | ICD-10-CM

## 2014-04-08 DIAGNOSIS — Z3A18 18 weeks gestation of pregnancy: Secondary | ICD-10-CM

## 2014-04-08 LAB — BASIC METABOLIC PANEL
Anion gap: 11 (ref 5–15)
BUN: 9 mg/dL (ref 6–23)
CALCIUM: 8.3 mg/dL — AB (ref 8.4–10.5)
CO2: 24 mEq/L (ref 19–32)
Chloride: 102 mEq/L (ref 96–112)
Creatinine, Ser: 0.77 mg/dL (ref 0.50–1.10)
Glucose, Bld: 82 mg/dL (ref 70–99)
Potassium: 4.4 mEq/L (ref 3.7–5.3)
Sodium: 137 mEq/L (ref 137–147)

## 2014-04-08 NOTE — Discharge Summary (Addendum)
Discharge Summary  # Patient left AGAINST MEDICAL ADVICE after all risk explained   Patient ID: Nicole Conrad,  MRN: [id number], DOB/AGE: 02-03-1986 28 y.o.  Admit date: 04/06/2014 Discharge date: 04/08/2014  Primary Care Provider: No PCP Per Patient Primary Cardiologist: Dr. Antoine PocheHochrein  Discharge Diagnoses Principal Problem:   Myocardial Infarction (NSTEMI) at [redacted] weeks gestation Active Problems:   Pain in the chest   Allergies No Known Allergies  Procedures  Echocardiogram 04/06/2014 LV EF: 55% - 60%  ------------------------------------------------------------------- Indications: Chest pain 786.51. MI - inferior wall - acute 410.41. [redacted] weeks gestation.  ------------------------------------------------------------------- History: Risk factors: Former tobacco use.  ------------------------------------------------------------------- Study Conclusions  - Left ventricle: The cavity size was normal. Systolic function was normal. The estimated ejection fraction was in the range of 55% to 60%. Wall motion was normal; there were no regional wall motion abnormalities. Left ventricular diastolic function parameters were normal. - Aortic valve: Trileaflet; normal thickness leaflets. There was no regurgitation. - Aortic root: The aortic root was normal in size. - Mitral valve: Structurally normal valve. There was no regurgitation. - Right ventricle: Systolic function was normal. - Pulmonic valve: There was mild regurgitation. - Pulmonary arteries: Systolic pressure was within the normal range. - Inferior vena cava: The vessel was dilated. The respirophasic diameter changes were blunted (< 50%), consistent with elevated central venous pressure. - Pericardium, extracardiac: There was no pericardial effusion.  Impressions:  - Normal biventricular size and systolic function. Normal diastolic function. Mild pulmonary regurgitation.      Coronary CT 04/07/2014  FINDINGS:    Non-cardiac: [facility].  Coronary Arteries: Right dominant with no anomalies  LM: No significant disease.  LAD system: No significant disease.  Circumflex system: There was a large ramus that appeared normal.  There was a small AV LCx with no significant disease.  RCA system: The RCA itself appeared normal. It divided into a PDA  and PLV. The PDA appeared normal. In the mid to distal PLV, there  was a subtotal occlusion. The vessel was relatively small in caliber  at the point of subtotal occlusion. I cannot definitively say if  this is dissection with occlusion due to intramural  hematoma/extrinsic compression versus thrombotic/embolic occlusion.  IMPRESSION:  There was subtotal occlusion of the mid to distal PLV. This was a  small caliber vessel. I cannot definitively say if this is  dissection with occlusion due to intramural hematoma/extrinsic  compression versus thrombotic/embolic occlusion.      Hospital Course  The patient is a 28 year old female who is in her 3617 week of pregnancy who presented to [facility] ED with chest pain 8 PM on the night of 04/05/2014. The chest discomfort radiated to the left arm. She was initially treated with Tylenol with improvement. The first EKG demonstrated questionable mild inferior ST elevation. Her initial troponin was also elevated at 1.02. Followup EKG showed T-wave inversion in the inferior leads. Dr. Eldridge DaceVaranasi was called by the [facility] physician. The on-call OB/GYN doctor at [facility] was called as well and the patient was cleared for heparin bolus, aspirin and nitroglycerin prior to her transfer to [facility], patient was chest pain-free on arrival. EKG after arrival does show T wave inversion in the inferior has involved somewhat.   Patient was seen by Dr. Jens Somrenshaw on the following day, at which time she was chest pain-free. 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 She was treated  with aspirin and low-dose metoprolol. Cardiac CT was ordered to further define coronary anatomy. The case has been discussed with Dr. Molli Posey with radiology department and felt that the risk to fetus is minimal given that she is second trimester. The scan will be done with an lead apron to protect the fetus from radiation. Echocardiogram was obtained on 9/30, which showed EF 55-60%, mild pulmonary regurg. Obstetrics was consulted which noted the patient had a history of 2 losses during pregnancy. Coronary CT obtained 04/07/2014 showed subtotal occlusion of mid to distal PLV, this was a small-caliber vessel. It is unclear whether it was due to the dissection with occlusion versus thrombotic/embolic occlusion. It was noted the patient has a previous thrombophilia workup which showed moderately positive IgM anticardiolipin antibody. Hematology was consulted on 04/08/2014 to assist with her hypercoagulation study and determine whether she needs anticoagulation as it is unclear whether her heart attack was due to intramural hematoma secondary to dissection versus thrombotic/embolic occlusion. Per hematology, it was recommended the patient proceed with antiplatelet anticoagulation with low-dose aspirin given her recent questionable coronary occlusion, low-dose aspirin should provide a reasonable prophylaxis and protection to her ongoing pregnancy. Prior to further workup, patient decided to leave AGAINST MEDICAL ADVICE. I have personally discussed with her risk of leaving AMA, including heart attack, stroke, and possibly death include her and her fetus, however patient and her husband insist on leave [facility] AGAINST MEDICAL ADVICE. Hematology PA and Social Worker has also discussed with family and patient who continue to insist on leave AMA.  I will leave a message with the offic scheduler to contact patient to set up followup with our [facility]  office. She left before she was given her AVS to prescribe the patient to take ASA or low dose beta blocker prior to her leaving AMA. She will be notified by phone to start taking ASA 81mg  daily and continue low dose beta blocker. It is also questionable whether she will be compliant or not. According to obstetrics consult, aspirin 81 mg is safe in pregnancy. Further research may needed to see if aspirin need to be discontinued in third trimester to prevent early closure of ductus arteriosus and kidney injury to the fetus. Of note, since she has a possible coronary artery dissection, it is recommended that she be delivered by C section and not allowed to push in labor.  If this is a dissection pushing in labor could promote extension of dissection.    Discharge Vitals Blood pressure 103/46, pulse 61, temperature 98.2 F (36.8 C), temperature source Oral, resp. rate 18, height 5' (1.524 m), weight 189 lb 9.5 oz (86 kg), SpO2 97.00%, not currently breastfeeding.  Filed Weights   04/06/14 0336 04/06/14 0623  Weight: 190 lb (86.183 kg) 189 lb 9.5 oz (86 kg)    Labs  CBC  Recent Labs  04/06/14 0340 04/06/14 0343 04/07/14 0354  WBC 15.7*  --  13.3*  HGB 11.2* 11.9* 10.9*  HCT 33.0* 35.0* 32.8*  MCV 91.9  --  91.6  PLT 181  --  196   Basic Metabolic Panel  Recent Labs  04/07/14 0354 04/08/14 0322  NA 138 137  K 4.1 4.4  CL 103 102  CO2 21 24  GLUCOSE 84 82  BUN 10 9  CREATININE 0.60 0.77  CALCIUM 8.3* 8.3*   Liver Function Tests  Recent Labs  04/06/14 0340  AST 52*  ALT 11  ALKPHOS 80  BILITOT 0.2*  PROT 6.3  ALBUMIN  2.8*   Cardiac Enzymes  Recent Labs  04/06/14 0706 04/06/14 1203 04/06/14 1853  TROPONINI >20.00* >20.00* 12.27*   Fasting Lipid Panel  Recent Labs  04/07/14 0354  CHOL 136  HDL 54  LDLCALC 54  TRIG 141  CHOLHDL 2.5   Thyroid Function Tests  Recent Labs  04/06/14 0706  TSH 0.640    Disposition  Pt left against medical advise by  cardiology, oncology and social worker.   Follow-up Plans & Appointments   Our Office scheduler will contact the patient to set up office followup in [facility] with Cardiology and also set up followup with Hematology.  Discharge Medications    Medication List    Notice   You have not been prescribed any medications.      Duration of Discharge Encounter   Greater than 30 minutes including physician time.  Ramond Dial PA-C Pager: [phone] 04/08/2014, 5:30 PM  Agree with above discharge summary with modifications as outlined by Azalee Course, PA-C.

## 2014-04-08 NOTE — Progress Notes (Signed)
Pt left hospital AMA. Nursing explained to pt the importance of her staying in hospital and being monitored her. Also that if she left AMA insurance may not cover hospital stay and that she is putting herself at a extremely high risk of having a repeat MI and or CVA. Pt VU. Husband at bedside with 7mth son. No encouragement received from husband!!!! Nursing asked social worker if she could speak with pt per hematology recommendation.  Pt continues to be adamant about going home.  Called Dr. Mayford Knifeurner, and Minerva FesterMingh Hao, PA about pt.  Pt left AMA.

## 2014-04-08 NOTE — Consult Note (Signed)
INPATIENT HEMATOLOGY- ONCOLOGY CONSULTATION  Patient Care Team: No Pcp Per Patient as PCP - General (General Practice)  REASON FOR CONSULT: Thrombosis  Consulting Physician: Eli Hose, MD  HISTORY OF PRESENTING ILLNESS:   Nicole Conrad 28 y.o. female  currently [redacted] weeks pregnant, transferred from [facility] due to acute coronary syndrome, non-Q wave,acute inferior myocardial infarction. She had presented to that facility with increasing chest pain found to have positive troponins, and was placed on IV heparin after OB Gyn clearance prior to transfer. Bedside echo suggested perhaps a mild inferior wall motion abnormality but overall preserved EF.  No lower extremity swelling, tenderness or erythema. denies worsening dyspnea on exertion. No pre-syncopal episodes, palpitations or hemoptysis. No trauma, recent surgery or long distance travel. She continues to smoke.She admits to sedentary lifestyle. Denies any prior history or diagnosis of cancer.  No birth control pills or hormone replacement therapy.  There is a likely family history of blood clots in her mother. Prior to admission she was  not on  ASA or NSAIDs. Risk factors include multiple pregnancies. She has two children at home with the youngest being 81 months old and prior miscarriages. The first was at 8 months pregnancy due to a blood clot, in 2004, with negative thrombophilia workup,and the second with twins at 5 months, unknown etiology.  On 03/16/13, after the second miscarriage, Hypercoagulable panel was negative for  lupus anticoagulant and  moderately positive IgM anticardiolipin antibody.   Labs are demonstrated leukocytosis, likely reactive, with a white count of 15.7. Her H&H was 12 and 35 respectively, with a normal platelet count of 181. chest x-ray was negative for acute disease. CT and you of the chest on 04/07/2014 was remarkable for subtotal occlusion of the need to the distal PLV, rule out thrombotic-embolic occlusion  vs. distal dissection versus a combination of both. She is on ASA at this time as indicated by OB GYN, as heparin is contraindicated.  Due to these findings, we  were requested to see the patient in consultation, to help in the management of the thrombotic disease. PAtient is in the process of leaving AMA despite recommendations to remain in hospital while workup is in progress and cardiac status is stabilized.   MEDICAL HISTORY:  Past Medical History  Diagnosis Date  . Klippel-Feil syndrome     Cervical spine fusion; scapula abnormality  . Solitary kidney, congenital   . Preterm labor   . NSTEMI at [redacted] weeks gestation affecting pregnancy 04/06/2014    Admitted to [facility]. Follow by Dr.McLeod in [place]; [facility] consulted during [facility] admission     SURGICAL HISTORY: Past Surgical History  Procedure Laterality Date  . No past surgeries      SOCIAL HISTORY: History   Social History  . Marital Status: Single    Spouse Name: N/A    Number of Children: 2  . Years of Education: N/A   Occupational History  . Not on file.   Social History Main Topics  . Smoking status: Former Smoker -- 0.25 packs/day    Quit date: 03/09/2013  . Smokeless tobacco: Not on file  . Alcohol Use: No  . Drug Use: No  . Sexual Activity: Yes    Birth Control/ Protection: None   Other Topics Concern  . Not on file   Social History Narrative  . No narrative on file    FAMILY HISTORY: Family History  Problem Relation Age of Onset  . CAD Mother 3  ALLERGIES:  has No Known Allergies.  MEDICATIONS:  Scheduled Meds: . aspirin EC  81 mg Oral Daily  . metoprolol tartrate  25 mg Oral BID   Continuous Infusions:  PRN Meds:.acetaminophen, nitroGLYCERIN, ondansetron (ZOFRAN) IV ROS: Constitutional: Denies fevers, chills or abnormal night sweats Eyes: Denies blurriness of vision, double vision or watery eyes Ears, nose, mouth, throat, and face: Denies mucositis or sore throat Respiratory:  Denies cough, dyspnea or wheezes Cardiovascular: Denies palpitation, complains of intermittent chest pain, denies lower extremity swelling Gastrointestinal:  Denies nausea, heartburn or change in bowel habits Skin: Denies abnormal skin rashes Lymphatics: Denies new lymphadenopathy or easy bruising Neurological:Denies numbness, tingling or new weaknesses Behavioral/Psych: Mood is stable, no new changes  All other systems were reviewed with the patient and are negative.   Physical Exam   Filed Vitals:   04/08/14 0450  BP: 103/46  Pulse: 61  Temp: 98.2 F (36.8 C)  Resp: 18   Filed Weights   04/06/14 0336 04/06/14 0623  Weight: 190 lb (86.183 kg) 189 lb 9.5 oz (86 kg)    GENERAL: alert, no distress and comfortable. Strong tobacco odor in the room. SKIN: skin color, texture, turgor are normal, no rashes or significant lesions. several tatooes present. EYES: normal, conjunctiva are pink and non-injected, sclera clear OROPHARYNX: no exudate, no erythema and lips, buccal mucosa, and tongue normal  NECK: supple, thyroid normal size, non-tender, without nodularity LYMPH:  no palpable lymphadenopathy in the cervical, axillary or inguinal LUNGS: clear to auscultation and percussion with normal breathing effort HEART: regular rate & rhythm and no murmurs and no lower extremity edema ABDOMEN: pregnant Musculoskeletal:no cyanosis of digits and no clubbing  PSYCH: alert & oriented x 3 with fluent speech NEURO: no focal motor/sensory deficits   LABORATORY DATA:    Recent Labs Lab 04/06/14 0340 04/06/14 0343 04/07/14 0354  WBC 15.7*  --  13.3*  HGB 11.2* 11.9* 10.9*  HCT 33.0* 35.0* 32.8*  PLT 181  --  196  MCV 91.9  --  91.6  MCH 31.2  --  30.4  MCHC 33.9  --  33.2  RDW 13.6  --  13.9    Chemistries   Recent Labs Lab 04/06/14 0340 04/06/14 0343 04/07/14 0354 04/08/14 0322  NA 136* 137 138 137  K 3.8 3.6* 4.1 4.4  CL 103 105 103 102  CO2 20  --  21 24  GLUCOSE 93  96 84 82  BUN 9 8 10 9   CREATININE 0.53 0.50 0.60 0.77  CALCIUM 8.5  --  8.3* 8.3*      Radiology Studies:      Dg Chest 2 View 04/06/2014 centered at the upper thoracic region. Normal heart size and pulmonary vascularity. No focal airspace disease or consolidation in the lungs. No blunting of costophrenic angles. No pneumothorax. Mediastinal contours appear intact.  IMPRESSION: No active cardiopulmonary disease.  Upper thoracic kyphoscoliosis.   Electronically Signed   By: Burman Nieves M.D.   On: 04/06/2014 04:21   Ct Coronary Morp W/cta Cor W/score W/ca W/cm &/or Wo/cm  04/07/2014    IMPRESSION: There was subtotal occlusion of the mid to distal PLV. This was a small caliber vessel. I cannot definitively say if this is dissection with occlusion due to intramural hematoma/extrinsic compression versus thrombotic/embolic occlusion.  Dalton Mclean   Electronically Signed   By: Marca Ancona M.D.   On: 04/07/2014 18:20   04/07/2014    CT CHEST   FINDINGS: The visualized portions  of the lungs are clear. The visualized portions of the mediastinum and chest wall show no significant abnormality.  IMPRESSION: No significant non-cardiac abnormality seen in visualized portion of the thorax.  Electronically Signed: By: Myles RosenthalJohn  Stahl M.D. On: 04/07/2014 16:31     ASSESSMENT/PLAN:    1. 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 Prior hypercoagulable studies were remarkable for moderately positive IgM anticardiolipin antibody, negative for lupus anticoagulant.  Anticoagulation therapies including  low molecular weight heparin such as Lovenox will be considered. She is on aspirin at this time.   Elastic compression stockings at 20-30 mmHg to reduce risks of chronic thrombophlebitis recommended while  these options being entertained.  Unfortunately, patient is adamant to leave the hospital today, despite having been informed of risk of MI recurrence, or thrombotic event which could lead to stroke/death. Recommend Case Management/Social Work involvement prior to discharge, to explain the necesity of her hospital stay until cardiac clearance, and initiation of tobacco cessation program.  Preventive measures such as avoiding hormonal supplement, avoiding cigarette smoking, keeping up-to-date with screening programs for early cancer detection, frequent ambulation for long distance travel and aggressive DVT prophylaxis in all surgical settings is recommended.  An appointment can be arranged upon discharge, preferably to follow up in [facility] for better patient compliance. If needed preoperatively, she  will need any interruption of her anticoagulation therapy for elective procedures.  2. Status post inferior myocardial infarction Likely due to subtotal occlusion of the mid to distal PLV. she is on aspirin and heparin by Cards  3. Full Code   **Disclaimer: This note was dictated with voice recognition software. Similar sounding words can inadvertently be transcribed and this note may contain transcription errors which may not have been corrected upon publication of note.**    WERTMAN,SARA E, PA-C 1:25 PM   Patient seen and examined please see the detailed consult note above. 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 After her last 2 miscarriages she had 2 successful pregnancies to term with the first one using aspirin and Lovenox and the second one with her youngest son who is 287 months old without any anticoagulation. She was hospitalized recently for acute chest pain and potentially non-Q-wave MI. She had a CT scan on 04/07/2014 which showed subtotal occlusion of the distal PLV, rule out  thrombotic-embolic occlusion vs. distal dissection versus a combination of both.  Clinically she is currently asymptomatic at this point. On physical examination she was awake alert did not appear in any acute distress. Her heart is regular rhythm lungs are clear to auscultation without any rhonchi with dullness to percussion. Legs had no edema.  Laboratory data reviewed and her hypocoagulable workup on 03/16/2014 was really unremarkable except for a elevated IgM anticardiolipin antibody which is an equivocal finding.  Impression and recommendation: This is a 28 year old rather complex history with recurrent miscarriages and equivocal finding on her hypercoagulable panel. She is currently pregnant and also has a recent finding of a subtotal occlusion of the PLV although cannot rule out dissection with occlusion. Recommendations at this point is to proceed with antiplatelet anticoagulation with low-dose aspirin given her recent questionable coronary occlusion. Also low-dose aspirin should help provide a reasonable prophylaxis and protection during her ongoing  pregnancy. I'm not quite sure about the role of Lovenox this particular setting as it might increase the risk of bleeding especially if we cannot confirm a coronary dissection. I think the risk of bleeding here might be too severe to consider full dose anticoagulation. I think aspirin is a reasonable choice at this time.  I recommend she follows up with her OB/GYN and maternal-fetal medicine for close monitoring upon her discharge.  [facility] MD 04/08/2014.

## 2014-04-08 NOTE — Progress Notes (Addendum)
SUBJECTIVE:  No further CP  OBJECTIVE:   Vitals:   Filed Vitals:   04/07/14 1228 04/07/14 1413 04/07/14 2104 04/08/14 0450  BP: 108/55 108/63 102/49 103/46  Pulse:  70 80 61  Temp: 98.1 F (36.7 C) 98.2 F (36.8 C) 98.1 F (36.7 C) 98.2 F (36.8 C)  TempSrc: Oral Oral Oral Oral  Resp: 23 20 20 18   Height:      Weight:      SpO2: 98% 98% 99% 97%   I&O's:   Intake/Output Summary (Last 24 hours) at 04/08/14 1610 Last data filed at 04/07/14 2140  Gross per 24 hour  Intake    720 ml  Output      0 ml  Net    720 ml   TELEMETRY: Reviewed telemetry pt in NSR:     PHYSICAL EXAM General: Well developed, well nourished, in no acute distress Head: Eyes PERRLA, No xanthomas.   Normal cephalic and atramatic  Lungs:   Clear bilaterally to auscultation and percussion. Heart:   HRRR S1 S2 Pulses are 2+ & equal. Abdomen: Bowel sounds are positive, abdomen soft and non-tender without masses  Extremities:   No clubbing, cyanosis or edema.  DP +1 Neuro: Alert and oriented X 3. Psych:  Good affect, responds appropriately   LABS: Basic Metabolic Panel:  Recent Labs  96/04/54 0354 04/08/14 0322  NA 138 137  K 4.1 4.4  CL 103 102  CO2 21 24  GLUCOSE 84 82  BUN 10 9  CREATININE 0.60 0.77  CALCIUM 8.3* 8.3*   Liver Function Tests:  Recent Labs  04/06/14 0340  AST 52*  ALT 11  ALKPHOS 80  BILITOT 0.2*  PROT 6.3  ALBUMIN 2.8*   No results found for this basename: LIPASE, AMYLASE,  in the last 72 hours CBC:  Recent Labs  04/06/14 0340 04/06/14 0343 04/07/14 0354  WBC 15.7*  --  13.3*  HGB 11.2* 11.9* 10.9*  HCT 33.0* 35.0* 32.8*  MCV 91.9  --  91.6  PLT 181  --  196   Cardiac Enzymes:  Recent Labs  04/06/14 0706 04/06/14 1203 04/06/14 1853  TROPONINI >20.00* >20.00* 12.27*   BNP: No components found with this basename: POCBNP,  D-Dimer: No results found for this basename: DDIMER,  in the last 72 hours Hemoglobin A1C: No results found for this  basename: HGBA1C,  in the last 72 hours Fasting Lipid Panel:  Recent Labs  04/07/14 0354  CHOL 136  HDL 54  LDLCALC 54  TRIG 141  CHOLHDL 2.5   Thyroid Function Tests:  Recent Labs  04/06/14 0706  TSH 0.640   Anemia Panel: No results found for this basename: VITAMINB12, FOLATE, FERRITIN, TIBC, IRON, RETICCTPCT,  in the last 72 hours Coag Panel:   No results found for this basename: INR, PROTIME    RADIOLOGY: Dg Chest 2 View  04/06/2014   CLINICAL DATA:  Chest pain and shortness of breath. Seventeen weeks pregnant. Shielded.  EXAM: CHEST  2 VIEW  COMPARISON:  None.  FINDINGS: Prominent thoracic scoliosis and kyphosis centered at the upper thoracic region. Normal heart size and pulmonary vascularity. No focal airspace disease or consolidation in the lungs. No blunting of costophrenic angles. No pneumothorax. Mediastinal contours appear intact.  IMPRESSION: No active cardiopulmonary disease.  Upper thoracic kyphoscoliosis.   Electronically Signed   By: Burman Nieves M.D.   On: 04/06/2014 04:21   Ct Coronary Morp W/cta Cor W/score W/ca W/cm &/or Wo/cm  04/07/2014   ADDENDUM REPORT: 04/07/2014 18:20  CLINICAL DATA:  Chest pain  EXAM: Cardiac CTA  MEDICATIONS: Sub lingual nitro. 4mg  and lopressor 25mg  po  TECHNIQUE: The patient was scanned on a Philips 256 slice scanner. Gantry rotation speed was 270 msecs. Collimation was .9mm. A 100 kV prospective scan was triggered in the descending thoracic aorta at 111 HU's with 5% padding centered around 78% of the R-R interval. Average HR during the scan was 60 bpm. The 3D data set was interpreted on a dedicated work station using MPR, MIP and VRT modes. A total of 80cc of contrast was used.  FINDINGS: Non-cardiac: [facility].  Coronary Arteries: Right dominant with no anomalies  LM:  No significant disease.  LAD system:  No significant disease.  Circumflex system: There was a large ramus that appeared normal. There  was a small AV LCx with no significant disease.  RCA system: The RCA itself appeared normal. It divided into a PDA and PLV. The PDA appeared normal. In the mid to distal PLV, there was a subtotal occlusion. The vessel was relatively small in caliber at the point of subtotal occlusion. I cannot definitively say if this is dissection with occlusion due to intramural hematoma/extrinsic compression versus thrombotic/embolic occlusion.  IMPRESSION: There was subtotal occlusion of the mid to distal PLV. This was a small caliber vessel. I cannot definitively say if this is dissection with occlusion due to intramural hematoma/extrinsic compression versus thrombotic/embolic occlusion.  Dalton Mclean   Electronically Signed   By: Marca Anconaalton  Mclean M.D.   On: 04/07/2014 18:20   04/07/2014   EXAM: OVER-READ INTERPRETATION  CT CHEST  The following report is an over-read performed by radiologist Dr. Deberah PeltonJohn Stahlof [facility] on 04/07/2014. This over-read does not include interpretation of cardiac or coronary anatomy or pathology. The coronary CTA interpretation by the cardiologist is attached.  COMPARISON:  None.  FINDINGS: The visualized portions of the lungs are clear. The visualized portions of the mediastinum and chest wall show no significant abnormality.  IMPRESSION: No significant non-cardiac abnormality seen in visualized portion of the thorax.  Electronically Signed: By: Myles RosenthalJohn  Stahl M.D. On: 04/07/2014 16:31   Assessment/Plan:  1 status post inferior myocardial infarction-Patient remains pain-free. Dr. Jens Somrenshaw had previously discussed patient with multiple physicians including Drs Elyn PeersMcalhany, Cooper, [place]uckey, and Bet[place]clean. He also discussed with Dr Molli PoseyMansell radiology and she underwent Coronary CTA to define coronary anatomy. This showed subtotal occlusion of the mid to distal PLV. This was a small caliber vessel and unable to definitively say if this is dissection with occlusion due to intramural  hematoma/extrinsic compression versus thrombotic/embolic occlusion.  I will get a hematology consult given her history of moderately positive IgM anti-cardiolipin AB and ? Of coronary artery thrombosis. Continue aspirin and low-dose metoprolol (increase to 25 mg po BID). Will plan medical therapy with potential DC Sat AM.  We do not know what the risk of recurrent dissection is as her pregnancy progresses. OB following. Echo shows normal LV function  2 17 week intrauterine pregnancy-following  3 hypokalemia-repleted  Quintella ReichertURNER,TRACI R, MD  04/08/2014  7:11 AM

## 2014-04-08 NOTE — Progress Notes (Signed)
Patient ID: Nicole BailiffMisty Conrad, female   DOB: 1986/06/21, 28 y.o.   MRN: [id number] FACULTY PRACTICE ANTEPARTUM(COMPREHENSIVE) NOTE  Nicole BailiffMisty Conrad is a 28 y.o. [id number] at approximately [redacted] weeks EGA  who is admitted for NSTEMI Length of Stay:  2  Days  Subjective: Patient denies  Chest pain, SOB Patient reports uterine contraction  activity as none. Patient reports  vaginal bleeding as none. Patient describes fluid per vagina as None.  Vitals:  Blood pressure 103/46, pulse 61, temperature 98.2 F (36.8 C), temperature source Oral, resp. rate 18, height 5' (1.524 m), weight 189 lb 9.5 oz (86 kg), SpO2 97.00%, not currently breastfeeding. Physical Examination:  General appearance - alert, well appearing, and in no distress Abdomen - soft, non tender Extremities - Homan's sign negative bilaterally  Fetal Monitoring:  Doppler + FH 144  Labs:  Results for orders placed during the hospital encounter of 04/06/14 (from the past 24 hour(s))  GLUCOSE, CAPILLARY   Collection Time    04/07/14 10:16 PM      Result Value Ref Range   Glucose-Capillary 89  70 - 99 mg/dL  BASIC METABOLIC PANEL   Collection Time    04/08/14  3:22 AM      Result Value Ref Range   Sodium 137  137 - 147 mEq/L   Potassium 4.4  3.7 - 5.3 mEq/L   Chloride 102  96 - 112 mEq/L   CO2 24  19 - 32 mEq/L   Glucose, Bld 82  70 - 99 mg/dL   BUN 9  6 - 23 mg/dL   Creatinine, Ser 6.210.77  0.50 - 1.10 mg/dL   Calcium 8.3 (*) 8.4 - 10.5 mg/dL   GFR calc non Af Amer >90  >90 mL/min   GFR calc Af Amer >90  >90 mL/min   Anion gap 11  5 - 15    Medications:  Scheduled . aspirin EC  81 mg Oral Daily  . metoprolol tartrate  25 mg Oral BID   I have reviewed the patient's current medications.  ASSESSMENT: 28 yo [id number] at approximately 15-16 weeks by LMP  Patient Active Problem List   Diagnosis Date Noted  . NSTEMI (non-ST elevated myocardial infarction) 04/06/2014  . Myocardial Infarction (NSTEMI) at [redacted] weeks gestation 04/06/2014   . Pain in the chest 04/06/2014  . Previous stillbirth at 24 weeks antepartum 04/06/2014  . Klippel-Feil syndrome   . Solitary kidney, congenital     PLAN: No obstetrical issue at this time. Spoke with Dr. Francee Conrad [place] in [place].  He is aware of her medical course.  He will assume care of her after discharge with co-management by MFM.  [facility]  Transfer during labor or an acute event would put patient at risk.  Pt would benefit from both OB and cards in teh same building at time of delivery.    Doppler by OB Rapid response nurse before discharge Will sign off.  Re consult if need arises.  We can be reached at [phone] 24/7  Conrad,Nicole H. 04/08/2014,9:22 AM

## 2014-04-10 ENCOUNTER — Telehealth: Payer: Self-pay | Admitting: Cardiology

## 2014-04-10 NOTE — Telephone Encounter (Signed)
Patient left AMA on Friday before meds provided.  Please notify patient and call in Metoprolol tartrate 25mg  BID and tell her to start ASA 81mg  daily.  She needs followup with Cardiology in our [place] office this week and with Hematology.  Also notify [place] OB GYN in [place] that it is recommended that when she nears delivery that she be delivered by C section.

## 2014-04-11 NOTE — Telephone Encounter (Signed)
lmtrc

## 2014-04-14 MED ORDER — METOPROLOL TARTRATE 25 MG PO TABS: 25.0000 mg | ORAL_TABLET | Freq: Two times a day (BID) | ORAL | Status: AC

## 2014-04-14 MED ORDER — ASPIRIN EC 81 MG PO TBEC: 81.0000 mg | DELAYED_RELEASE_TABLET | Freq: Every day | ORAL | Status: AC

## 2014-04-14 NOTE — Telephone Encounter (Signed)
Yes - they have already seen her in the hostpial so it is just a folllowup for hypercoagulable state

## 2014-04-14 NOTE — Telephone Encounter (Addendum)
Pt notified. Pt started Aspirin after leaving the hospital RX for Metoprolol sent in. Note faxed to pts [facility] Dr. Mora ApplMcLeod. [facility] and she will need to be referred to Hematology. Dr. Mayford Knifeurner, ok to refer to Hematology? Dx Code?

## 2014-04-14 NOTE — Addendum Note (Signed)
Addended byOrlene Plum: Stesha Neyens H on: 04/14/2014 10:21 AM   Modules accepted: Orders

## 2014-04-14 NOTE — Telephone Encounter (Signed)
Message sent for post hospital appointment to be [facility].

## 2014-04-15 NOTE — Telephone Encounter (Signed)
Message sent to scheduling to schedule pt with Dr. Clelia CroftShadad (hematology).

## 2014-04-25 NOTE — Telephone Encounter (Signed)
No appointments made that I can see [facility] or [facility]. [facility] follow [facility].

## 2014-05-04 ENCOUNTER — Encounter: Payer: Medicaid Other | Admitting: Cardiovascular Disease

## 2014-05-09 ENCOUNTER — Encounter (HOSPITAL_COMMUNITY): Payer: Self-pay | Admitting: Emergency Medicine

## 2014-05-13 ENCOUNTER — Encounter: Payer: Self-pay | Admitting: Cardiovascular Disease

## 2014-05-13 ENCOUNTER — Ambulatory Visit (INDEPENDENT_AMBULATORY_CARE_PROVIDER_SITE_OTHER): Payer: Medicaid Other | Admitting: Cardiovascular Disease

## 2014-05-13 VITALS — BP 109/71 | HR 78 | Ht 60.0 in | Wt 189.0 lb

## 2014-05-13 DIAGNOSIS — Z87898 Personal history of other specified conditions: Secondary | ICD-10-CM

## 2014-05-13 DIAGNOSIS — D6859 Other primary thrombophilia: Secondary | ICD-10-CM | POA: Diagnosis not present

## 2014-05-13 DIAGNOSIS — I2510 Atherosclerotic heart disease of native coronary artery without angina pectoris: Secondary | ICD-10-CM | POA: Diagnosis not present

## 2014-05-13 DIAGNOSIS — I214 Non-ST elevation (NSTEMI) myocardial infarction: Secondary | ICD-10-CM | POA: Diagnosis not present

## 2014-05-13 DIAGNOSIS — Z9289 Personal history of other medical treatment: Secondary | ICD-10-CM

## 2014-05-13 NOTE — Progress Notes (Signed)
Patient ID: Nicole Conrad, female   DOB: [birth year], 28 y.o.   MRN: [id number]      SUBJECTIVE:  The patient is a 28 year old woman who was recently hospitalized for chest pain during her 17th week of pregnancy. The initial ECG demonstrated questionable mild inferior ST elevation with an initial troponin elevated at 1.02. Follow-up ECG showed T-wave inversions in the inferior leads. There was some thought that these ECG changes were related to coronary dissection. She was treated with aspirin and low-dose metoprolol. Cardiac CT was ordered to further define coronary anatomy. Echocardiogram demonstrated normal left ventricular systolic function, EF 55-60%. Coronary CT obtained on 04/07/2014 showed subtotal occlusion of the mid to distal PLV which was a small caliber vessel. It was unclear whether this was due to dissection with occlusion due to intramural hematoma/extrinsic compression versus thrombotic/embolic occlusion.  It was noted that the patient had a previous thrombophilia workup which showed moderately positive IgM anticardiolipin antibody. She has had two previous stillbirths. Hematology was consulted and they agreed with low-dose aspirin therapy.  Chest x-ray showed thoracic kyphoscoliosis. The patient then left AMA.  She denies chest pain and palpitations as well as syncope. She has not had any bleeding problems. She is unmarried.    Review of Systems: As per "subjective", otherwise negative.  No Known Allergies  Current Outpatient Prescriptions  Medication Sig Dispense Refill  . aspirin EC 81 MG tablet Take 1 tablet (81 mg total) by mouth daily. 90 tablet 3  . metoprolol tartrate (LOPRESSOR) 25 MG tablet Take 1 tablet (25 mg total) by mouth 2 (two) times daily. 60 tablet 1   No current facility-administered medications for this visit.    Past Medical History  Diagnosis Date  . Klippel-Feil syndrome     Cervical spine fusion; scapula abnormality  . Solitary kidney, congenital    . Preterm labor   . NSTEMI at [redacted] weeks gestation affecting pregnancy 04/06/2014    [facility]. Follow by Dr.McLeod in [place]; [facility]     Past Surgical History  Procedure Laterality Date  . No past surgeries      History   Social History  . Marital Status: Single    Spouse Name: N/A    Number of Children: 2  . Years of Education: N/A   Occupational History  . Not on file.   Social History Main Topics  . Smoking status: Former Smoker -- 0.25 packs/day    Quit date: 03/09/2013  . Smokeless tobacco: Not on file  . Alcohol Use: No  . Drug Use: No  . Sexual Activity: Yes    Birth Control/ Protection: None   Other Topics Concern  . Not on file   Social History Narrative     BP 109/71  Pulse 78  SpO2 98% Weight 189 lb (85.73 kg) Height 5' (1.524 m)    PHYSICAL EXAM General: NAD HEENT: Normal. Neck: No JVD, no thyromegaly. Lungs: Clear to auscultation bilaterally with normal respiratory effort. CV: Nondisplaced PMI.  Regular rate and rhythm, normal S1/S2, no S3/S4, no murmur. No pretibial or periankle edema.  No carotid bruit.  Normal pedal pulses.  Abdomen: Soft, nontender, + distention.  Neurologic: Alert and oriented x 3.  Psych: Normal affect. Skin: Normal. Musculoskeletal: Normal range of motion, no gross deformities. Extremities: No clubbing or cyanosis.   ECG: Most recent ECG reviewed.     ASSESSMENT AND PLAN: 1. CAD with inferior wall NSTEMI: Symptomatically stable. Given likelihood  of hypercoagulability disorder, will continue ASA and low dose beta blocker. 2. Hypercoagulability: Continue ASA 81 mg. Will make a referral to hematology in [place].  Dispo: f/u 8 weeks.  Prentice DockerSuresh Maylie Ashton, M.D., F.A.C.C.

## 2014-05-13 NOTE — Patient Instructions (Addendum)
Continue all current medications. Referral to Hematology Follow up in  2 months

## 2014-05-30 ENCOUNTER — Ambulatory Visit (HOSPITAL_COMMUNITY): Payer: Medicaid Other

## 2014-05-30 NOTE — Progress Notes (Signed)
This encounter was created in error - please disregard.

## 2014-06-06 ENCOUNTER — Encounter (HOSPITAL_COMMUNITY): Payer: Self-pay

## 2014-06-07 ENCOUNTER — Encounter (HOSPITAL_COMMUNITY): Payer: Self-pay | Admitting: Lab

## 2014-06-07 NOTE — Progress Notes (Signed)
Patient did not show up for new appt at [facility].  I called the patient and left a message and also called the referring Dr office also to let them know.

## 2014-06-21 ENCOUNTER — Ambulatory Visit (HOSPITAL_COMMUNITY): Payer: Medicaid Other

## 2014-06-30 ENCOUNTER — Encounter (HOSPITAL_COMMUNITY): Payer: Self-pay

## 2014-07-26 ENCOUNTER — Ambulatory Visit: Payer: Medicaid Other | Admitting: Cardiovascular Disease

## 2014-07-27 ENCOUNTER — Encounter: Payer: Self-pay | Admitting: Cardiovascular Disease

## 2014-08-03 ENCOUNTER — Ambulatory Visit (HOSPITAL_COMMUNITY): Payer: Medicaid Other | Admitting: Hematology & Oncology

## 2014-08-12 ENCOUNTER — Encounter (HOSPITAL_COMMUNITY): Payer: Self-pay

## 2014-12-17 ENCOUNTER — Emergency Department (HOSPITAL_COMMUNITY)
Admission: EM | Admit: 2014-12-17 | Discharge: 2014-12-18 | Disposition: A | Discharge status: Home / Self Care | Payer: Medicaid Other | Attending: Emergency Medicine | Admitting: Emergency Medicine

## 2014-12-17 ENCOUNTER — Encounter (HOSPITAL_COMMUNITY): Payer: Self-pay | Admitting: *Deleted

## 2014-12-17 DIAGNOSIS — Q600 Renal agenesis, unilateral: Secondary | ICD-10-CM | POA: Insufficient documentation

## 2014-12-17 DIAGNOSIS — Q761 Klippel-Feil syndrome: Secondary | ICD-10-CM | POA: Insufficient documentation

## 2014-12-17 DIAGNOSIS — K8020 Calculus of gallbladder without cholecystitis without obstruction: Secondary | ICD-10-CM | POA: Diagnosis not present

## 2014-12-17 DIAGNOSIS — R1010 Upper abdominal pain, unspecified: Secondary | ICD-10-CM | POA: Diagnosis present

## 2014-12-17 DIAGNOSIS — Z87891 Personal history of nicotine dependence: Secondary | ICD-10-CM | POA: Insufficient documentation

## 2014-12-17 DIAGNOSIS — Z3202 Encounter for pregnancy test, result negative: Secondary | ICD-10-CM | POA: Diagnosis not present

## 2014-12-17 DIAGNOSIS — I252 Old myocardial infarction: Secondary | ICD-10-CM | POA: Diagnosis not present

## 2014-12-17 DIAGNOSIS — R1013 Epigastric pain: Secondary | ICD-10-CM

## 2014-12-17 LAB — CBC WITH DIFFERENTIAL/PLATELET
BASOS PCT: 0 % (ref 0–1)
Basophils Absolute: 0 10*3/uL (ref 0.0–0.1)
EOS PCT: 2 % (ref 0–5)
Eosinophils Absolute: 0.4 10*3/uL (ref 0.0–0.7)
HCT: 40.5 % (ref 36.0–46.0)
HEMOGLOBIN: 13.4 g/dL (ref 12.0–15.0)
Lymphocytes Relative: 12 % (ref 12–46)
Lymphs Abs: 2 10*3/uL (ref 0.7–4.0)
MCH: 30.4 pg (ref 26.0–34.0)
MCHC: 33.1 g/dL (ref 30.0–36.0)
MCV: 91.8 fL (ref 78.0–100.0)
MONOS PCT: 7 % (ref 3–12)
Monocytes Absolute: 1.3 10*3/uL — ABNORMAL HIGH (ref 0.1–1.0)
NEUTROS ABS: 13.4 10*3/uL — AB (ref 1.7–7.7)
Neutrophils Relative %: 79 % — ABNORMAL HIGH (ref 43–77)
PLATELETS: 249 10*3/uL (ref 150–400)
RBC: 4.41 MIL/uL (ref 3.87–5.11)
RDW: 14.1 % (ref 11.5–15.5)
WBC: 17 10*3/uL — ABNORMAL HIGH (ref 4.0–10.5)

## 2014-12-17 LAB — URINALYSIS, ROUTINE W REFLEX MICROSCOPIC
BILIRUBIN URINE: NEGATIVE
GLUCOSE, UA: NEGATIVE mg/dL
Hgb urine dipstick: NEGATIVE
Ketones, ur: NEGATIVE mg/dL
Leukocytes, UA: NEGATIVE
Nitrite: NEGATIVE
PROTEIN: NEGATIVE mg/dL
Specific Gravity, Urine: 1.015 (ref 1.005–1.030)
UROBILINOGEN UA: 0.2 mg/dL (ref 0.0–1.0)
pH: 6 (ref 5.0–8.0)

## 2014-12-17 LAB — PREGNANCY, URINE: PREG TEST UR: NEGATIVE

## 2014-12-17 MED ORDER — SODIUM CHLORIDE 0.9 % IV BOLUS (SEPSIS)
1000.0000 mL | Freq: Once | INTRAVENOUS | Status: AC
  Administered 2014-12-17: 1000 mL via INTRAVENOUS

## 2014-12-17 MED ORDER — ONDANSETRON HCL 4 MG/2ML IJ SOLN
4.0000 mg | Freq: Once | INTRAMUSCULAR | Status: AC
  Administered 2014-12-17: 4 mg via INTRAMUSCULAR
  Filled 2014-12-17: qty 2

## 2014-12-17 MED ORDER — MORPHINE SULFATE 4 MG/ML IJ SOLN
4.0000 mg | Freq: Once | INTRAMUSCULAR | Status: AC
Start: 2014-12-17 — End: 2014-12-17
  Administered 2014-12-17: 4 mg via INTRAVENOUS
  Filled 2014-12-17: qty 1

## 2014-12-17 NOTE — ED Provider Notes (Signed)
CSN: [id number]     Arrival date & time 12/17/14  2046 History  This chart was scribed for Nicole Baton, MD by Evon Slack, ED Scribe. This patient was seen in room APA16A/APA16A and the patient's care was started at 11:11 PM.      Chief Complaint  Patient presents with  . Abdominal Pain   Patient is a 29 y.o. female presenting with abdominal pain. The history is provided by the patient. No language interpreter was used.  Abdominal Pain Associated symptoms: nausea and vomiting   Associated symptoms: no chest pain, no cough, no dysuria, no fever and no shortness of breath    HPI Comments: Nicole Conrad is a 29 y.o. female with PMHx solitary kidney who presents to the Emergency Department complaining of new sharp intermittent upper abdominal pain onset today at 4 PM. Pt rates the severity of her pain 8/10. Pt states that the pain radiates to her back. Pt states she has associated nausea. Pt deneis any alleviating or worsening factors. No association with food. +vomiting.  Pt denies  diarrhea, CP, SOB, hematuria or dysuria. Pt states that her LMP was march 2016, due to being on IUD.     Past Medical History  Diagnosis Date  . Klippel-Feil syndrome     Cervical spine fusion; scapula abnormality  . Solitary kidney, congenital   . Preterm labor   . NSTEMI at [redacted] weeks gestation affecting pregnancy 04/06/2014    Admitted to [facility]. Follow by Dr.McLeod in [place]; [facility] consulted during [facility] admission    Past Surgical History  Procedure Laterality Date  . No past surgeries     Family History  Problem Relation Age of Onset  . CAD Mother 40   History  Substance Use Topics  . Smoking status: Former Smoker -- 0.25 packs/day    Start date: 01/11/1999    Quit date: 03/09/2013  . Smokeless tobacco: Not on file  . Alcohol Use: No   OB History    Gravida Para Term Preterm AB TAB SAB Ectopic Multiple Living       Review of Systems  Constitutional:  Negative for fever.  Respiratory: Negative for cough, chest tightness and shortness of breath.   Cardiovascular: Negative for chest pain.  Gastrointestinal: Positive for nausea, vomiting and abdominal pain.  Genitourinary: Negative for dysuria.  Musculoskeletal: Negative for back pain.  Skin: Negative for wound.  Neurological: Negative for headaches.  All other systems reviewed and are negative.     Allergies  Review of patient's allergies indicates no known allergies.  Home Medications   Prior to Admission medications   Medication Sig Start Date End Date Taking? Authorizing Provider  aspirin EC 81 MG tablet Take 1 tablet (81 mg total) by mouth daily. 04/14/14   Corky Crafts, MD  HYDROcodone-acetaminophen (NORCO/VICODIN) 5-325 MG per tablet Take 2 tablets by mouth every 6 (six) hours as needed. 12/18/14   Nicole Baton, MD  metoprolol tartrate (LOPRESSOR) 25 MG tablet Take 1 tablet (25 mg total) by mouth 2 (two) times daily. 04/14/14   Corky Crafts, MD  ondansetron (ZOFRAN ODT) 4 MG disintegrating tablet Take 1 tablet (4 mg total) by mouth every 8 (eight) hours as needed for nausea or vomiting. 12/18/14   Nicole Baton, MD   BP 126/85 mmHg  Pulse 74  Temp(Src) 98.4 F (36.9 C) (Oral)  Resp 18  Ht 5' (1.524 m)  Wt 180 lb (81.647 kg)  BMI 35.15 kg/m2  SpO2 100%  LMP 09/12/2013  Breastfeeding? Unknown   Physical Exam  Constitutional: She is oriented to person, place, and time. No distress.  HENT:  Head: Normocephalic and atraumatic.  Cardiovascular: Normal rate, regular rhythm and normal heart sounds.   No murmur heard. Pulmonary/Chest: Effort normal and breath sounds normal. No respiratory distress. She has no wheezes.  Abdominal: Soft. Bowel sounds are normal. There is tenderness. There is no rebound and no guarding.  Epigastric and right upper quadrant tenderness to palpation without rebound or guarding  Neurological: She is alert and oriented to  person, place, and time.  Skin: Skin is warm and dry.  Psychiatric: She has a normal mood and affect.  Nursing note and vitals reviewed.   ED Course  Procedures (including critical care time) DIAGNOSTIC STUDIES: Oxygen Saturation is 98% on RA, normal by my interpretation.    COORDINATION OF CARE: 11:18 PM-Discussed treatment plan with pt at bedside and pt agreed to plan.     Labs Review Labs Reviewed  URINALYSIS, ROUTINE W REFLEX MICROSCOPIC (NOT AT [facility]) - Abnormal; Notable for the following:    APPearance HAZY (*)    All other components within normal limits  CBC WITH DIFFERENTIAL/PLATELET - Abnormal; Notable for the following:    WBC 17.0 (*)    Neutrophils Relative % 79 (*)    Neutro Abs 13.4 (*)    Monocytes Absolute 1.3 (*)    All other components within normal limits  COMPREHENSIVE METABOLIC PANEL - Abnormal; Notable for the following:    AST 43 (*)    Alkaline Phosphatase 141 (*)    All other components within normal limits  LIPASE, BLOOD - Abnormal; Notable for the following:    Lipase 16 (*)    All other components within normal limits  PREGNANCY, URINE    Imaging Review Ct Abdomen Pelvis W Contrast  12/18/2014   CLINICAL DATA:  Upper abdominal pain beginning at 4 p.m. congenital solitary kidney.  EXAM: CT ABDOMEN AND PELVIS WITH CONTRAST  TECHNIQUE: Multidetector CT imaging of the abdomen and pelvis was performed using the standard protocol following bolus administration of intravenous contrast.  CONTRAST:  45mL OMNIPAQUE IOHEXOL 300 MG/ML SOLN, OMNIPAQUE IOHEXOL 300 MG/ML SOLN  COMPARISON:  None.  FINDINGS: LUNG BASES: Included view of the lung bases are clear. Visualized heart and pericardium are unremarkable.  SOLID ORGANS: The liver, is mildly enlarged and otherwise unremarkable. Spleen, pancreas and adrenal glands are unremarkable. Multiple subcentimeter gallstones, without CT findings of acute cholecystitis.  GASTROINTESTINAL TRACT: The stomach, small and  large bowel are normal in course and caliber without inflammatory changes. Enteric contrast has not yet reached the distal small bowel. The appendix is not discretely identified, however there are no inflammatory changes in the right lower quadrant.  KIDNEYS/ URINARY TRACT: RIGHT kidney is orthotopic, demonstrating normal enhancement, compensatory nephro megaly. Absent LEFT kidney consistent with history 3.5 mm nonobstructing RIGHT upper pole nephrolithiasis. No hydronephrosis or solid renal masses. Urinary bladder is partially distended and unremarkable.  PERITONEUM/RETROPERITONEUM: Aortoiliac vessels are normal in course and caliber. No lymphadenopathy by CT size criteria. IUD within the central uterus. No intraperitoneal free fluid nor free air.  SOFT TISSUE/OSSEOUS STRUCTURES: Non-suspicious. Subcentimeter LEFT femoral head probable bone island. 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 Severe sclerosis about the RIGHT sacroiliac joint can be seen with osteitis ileitis condensans or possible sacroiliitis.  IMPRESSION: No acute intra-abdominal or pelvic process.  Solitary  RIGHT kidney with non obstructing 3.5 mm RIGHT upper pole nephrolithiasis.  Mild hepatomegaly. Cholelithiasis without CT findings of acute cholecystitis.   Electronically Signed   By: Awilda Metro M.D.   On: 12/18/2014 02:25     EKG Interpretation   Date/Time:  Sunday December 18 2014 02:59:22 EDT Ventricular Rate:  79 PR Interval:  162 QRS Duration: 93 QT Interval:  375 QTC Calculation: 430 R Axis:   22 Text Interpretation:  Sinus rhythm Confirmed by Rasheed Welty  MD, Kamry Faraci  ([id number]) on 12/18/2014 4:13:29 AM      MDM   Final diagnoses:  Epigastric pain  Gallstones    Patient presents with epigastric abdominal pain and vomiting. Nontoxic on exam. Vital signs are reassuring. She does have epigastric and right upper quadrant tenderness to palpation. Suspicious for gastritis versus pancreatitis versus cholecystitis.  Patient was given pain and nausea medication. 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 EKG at this time is nonischemic. Lab work obtained and largely reassuring with the exception of leukocytosis to 17. Because of this, CT scan was obtained. CT scan shows cholelithiasis without evidence of cholecystitis. Patient may just be experiencing viral gastritis. Discussed findings with the patient. She was able to tolerate fluids. She will be discharged on a short course of pain and nausea medication.  After history, exam, and medical workup I feel the patient has been appropriately medically screened and is safe for discharge home. Pertinent diagnoses were discussed with the patient. Patient was given return precautions.  I personally performed the services described in this documentation, which was scribed in my presence. The recorded information has been reviewed and is accurate.      Nicole Baton, MD 12/18/14 [phone]

## 2014-12-17 NOTE — ED Notes (Signed)
Pt c/o upper abdominal pain and nausea since 6pm. Denies urinary symptoms.

## 2014-12-18 ENCOUNTER — Emergency Department (HOSPITAL_COMMUNITY): Payer: Medicaid Other

## 2014-12-18 LAB — COMPREHENSIVE METABOLIC PANEL
ALBUMIN: 3.8 g/dL (ref 3.5–5.0)
ALK PHOS: 141 U/L — AB (ref 38–126)
ALT: 24 U/L (ref 14–54)
ANION GAP: 9 (ref 5–15)
AST: 43 U/L — AB (ref 15–41)
BUN: 16 mg/dL (ref 6–20)
CALCIUM: 8.9 mg/dL (ref 8.9–10.3)
CHLORIDE: 105 mmol/L (ref 101–111)
CO2: 23 mmol/L (ref 22–32)
CREATININE: 0.66 mg/dL (ref 0.44–1.00)
GFR calc Af Amer: >60 mL/min (ref >60)
GFR calc non Af Amer: >60 mL/min (ref >60)
Glucose, Bld: 99 mg/dL (ref 65–99)
Potassium: 3.8 mmol/L (ref 3.5–5.1)
Sodium: 137 mmol/L (ref 135–145)
TOTAL PROTEIN: 7.1 g/dL (ref 6.5–8.1)
Total Bilirubin: 0.6 mg/dL (ref 0.3–1.2)

## 2014-12-18 LAB — LIPASE, BLOOD: Lipase: 16 U/L — ABNORMAL LOW (ref 22–51)

## 2014-12-18 MED ORDER — IOHEXOL 300 MG/ML  SOLN
25.0000 mL | Freq: Once | INTRAMUSCULAR | Status: AC | PRN
  Administered 2014-12-18: 25 mL via ORAL

## 2014-12-18 MED ORDER — IOHEXOL 300 MG/ML  SOLN
100.0000 mL | Freq: Once | INTRAMUSCULAR | Status: AC | PRN
  Administered 2014-12-18: 100 mL via INTRAVENOUS

## 2014-12-18 MED ORDER — SODIUM CHLORIDE 0.9 % IJ SOLN
INTRAMUSCULAR | Status: AC
  Filled 2014-12-18: qty 500

## 2014-12-18 MED ORDER — ONDANSETRON 4 MG PO TBDP: 4.0000 mg | ORAL_TABLET | Freq: Three times a day (TID) | ORAL | Status: AC | PRN

## 2014-12-18 MED ORDER — HYDROCODONE-ACETAMINOPHEN 5-325 MG PO TABS: 2.0000 | ORAL_TABLET | Freq: Four times a day (QID) | ORAL | Status: AC | PRN

## 2014-12-18 MED ORDER — SODIUM CHLORIDE 0.9 % IJ SOLN
INTRAMUSCULAR | Status: AC
  Filled 2014-12-18: qty 30

## 2014-12-18 NOTE — ED Notes (Signed)
Discharge instructions given, pt demonstrated teach back and verbal understanding. No concerns voiced.  

## 2014-12-18 NOTE — Discharge Instructions (Signed)
You were seen today for abdominal pain and vomiting. Your workup is reassuring. Your CT scan did show gallstones but no signs of gallbladder inflammation.  He may have a viral illness. He will be given Zofran and a short course of pain medication. If your symptoms worsen you should be reevaluated.  Abdominal Pain, Women Abdominal (stomach, pelvic, or belly) pain can be caused by many things. It is important to tell your doctor:  The location of the pain.  Does it come and go or is it present all the time?  Are there things that start the pain (eating certain foods, exercise)?  Are there other symptoms associated with the pain (fever, nausea, vomiting, diarrhea)? All of this is helpful to know when trying to find the cause of the pain. CAUSES   Stomach: virus or bacteria infection, or ulcer.  Intestine: appendicitis (inflamed appendix), regional ileitis (Crohn's disease), ulcerative colitis (inflamed colon), irritable bowel syndrome, diverticulitis (inflamed diverticulum of the colon), or cancer of the stomach or intestine.  Gallbladder disease or stones in the gallbladder.  Kidney disease, kidney stones, or infection.  Pancreas infection or cancer.  Fibromyalgia (pain disorder).  Diseases of the female organs:  Uterus: fibroid (non-cancerous) tumors or infection.  Fallopian tubes: infection or tubal pregnancy.  Ovary: cysts or tumors.  Pelvic adhesions (scar tissue).  Endometriosis (uterus lining tissue growing in the pelvis and on the pelvic organs).  Pelvic congestion syndrome (female organs filling up with blood just before the menstrual period).  Pain with the menstrual period.  Pain with ovulation (producing an egg).  Pain with an IUD (intrauterine device, birth control) in the uterus.  Cancer of the female organs.  Functional pain (pain not caused by a disease, may improve without treatment).  Psychological pain.  Depression. DIAGNOSIS  Your doctor will  decide the seriousness of your pain by doing an examination.  Blood tests.  X-rays.  Ultrasound.  CT scan (computed tomography, special type of X-ray).  MRI (magnetic resonance imaging).  Cultures, for infection.  Barium enema (dye inserted in the large intestine, to better view it with X-rays).  Colonoscopy (looking in intestine with a lighted tube).  Laparoscopy (minor surgery, looking in abdomen with a lighted tube).  Major abdominal exploratory surgery (looking in abdomen with a large incision). TREATMENT  The treatment will depend on the cause of the pain.   Many cases can be observed and treated at home.  Over-the-counter medicines recommended by your caregiver.  Prescription medicine.  Antibiotics, for infection.  Birth control pills, for painful periods or for ovulation pain.  Hormone treatment, for endometriosis.  Nerve blocking injections.  Physical therapy.  Antidepressants.  Counseling with a psychologist or psychiatrist.  Minor or major surgery. HOME CARE INSTRUCTIONS   Do not take laxatives, unless directed by your caregiver.  Take over-the-counter pain medicine only if ordered by your caregiver. Do not take aspirin because it can cause an upset stomach or bleeding.  Try a clear liquid diet (broth or water) as ordered by your caregiver. Slowly move to a bland diet, as tolerated, if the pain is related to the stomach or intestine.  Have a thermometer and take your temperature several times a day, and record it.  Bed rest and sleep, if it helps the pain.  Avoid sexual intercourse, if it causes pain.  Avoid stressful situations.  Keep your follow-up appointments and tests, as your caregiver orders.  If the pain does not go away with medicine or surgery, you may  try:  Acupuncture.  Relaxation exercises (yoga, meditation).  Group therapy.  Counseling. SEEK MEDICAL CARE IF:   You notice certain foods cause stomach pain.  Your home  care treatment is not helping your pain.  You need stronger pain medicine.  You want your IUD removed.  You feel faint or lightheaded.  You develop nausea and vomiting.  You develop a rash.  You are having side effects or an allergy to your medicine. SEEK IMMEDIATE MEDICAL CARE IF:   Your pain does not go away or gets worse.  You have a fever.  Your pain is felt only in portions of the abdomen. The right side could possibly be appendicitis. The left lower portion of the abdomen could be colitis or diverticulitis.  You are passing blood in your stools (bright red or black tarry stools, with or without vomiting).  You have blood in your urine.  You develop chills, with or without a fever.  You pass out. MAKE SURE YOU:   Understand these instructions.  Will watch your condition.  Will get help right away if you are not doing well or get worse. Document Released: 04/21/2007 Document Revised: 11/08/2013 Document Reviewed: 05/11/2009 [facility] Patient Information 2015 [place], [place]. This information is not intended to replace advice given to you by your health care provider. Make sure you discuss any questions you have with your health care provider.

## 2015-01-24 ENCOUNTER — Encounter (HOSPITAL_COMMUNITY): Payer: Self-pay | Admitting: Emergency Medicine

## 2015-01-24 ENCOUNTER — Emergency Department (HOSPITAL_COMMUNITY)
Admission: EM | Admit: 2015-01-24 | Discharge: 2015-01-24 | Disposition: A | Discharge status: Home / Self Care | Payer: Medicaid Other | Attending: Emergency Medicine | Admitting: Emergency Medicine

## 2015-01-24 DIAGNOSIS — M25519 Pain in unspecified shoulder: Secondary | ICD-10-CM | POA: Diagnosis not present

## 2015-01-24 DIAGNOSIS — H6693 Otitis media, unspecified, bilateral: Secondary | ICD-10-CM | POA: Diagnosis not present

## 2015-01-24 DIAGNOSIS — R51 Headache: Secondary | ICD-10-CM | POA: Insufficient documentation

## 2015-01-24 DIAGNOSIS — Z8751 Personal history of pre-term labor: Secondary | ICD-10-CM | POA: Diagnosis not present

## 2015-01-24 DIAGNOSIS — Q761 Klippel-Feil syndrome: Secondary | ICD-10-CM | POA: Insufficient documentation

## 2015-01-24 DIAGNOSIS — Z87891 Personal history of nicotine dependence: Secondary | ICD-10-CM | POA: Insufficient documentation

## 2015-01-24 DIAGNOSIS — Q600 Renal agenesis, unilateral: Secondary | ICD-10-CM | POA: Insufficient documentation

## 2015-01-24 DIAGNOSIS — Z7982 Long term (current) use of aspirin: Secondary | ICD-10-CM | POA: Diagnosis not present

## 2015-01-24 DIAGNOSIS — M542 Cervicalgia: Secondary | ICD-10-CM | POA: Diagnosis not present

## 2015-01-24 DIAGNOSIS — R519 Headache, unspecified: Secondary | ICD-10-CM

## 2015-01-24 DIAGNOSIS — Z79899 Other long term (current) drug therapy: Secondary | ICD-10-CM | POA: Diagnosis not present

## 2015-01-24 MED ORDER — CIPROFLOXACIN-DEXAMETHASONE 0.3-0.1 % OT SUSP: 4.0000 [drp] | Freq: Two times a day (BID) | OTIC | Status: AC

## 2015-01-24 MED ORDER — KETOROLAC TROMETHAMINE 60 MG/2ML IM SOLN
60.0000 mg | Freq: Once | INTRAMUSCULAR | Status: AC
  Administered 2015-01-24: 60 mg via INTRAMUSCULAR
  Filled 2015-01-24: qty 2

## 2015-01-24 MED ORDER — AMOXICILLIN-POT CLAVULANATE 875-125 MG PO TABS: 1.0000 | ORAL_TABLET | Freq: Two times a day (BID) | ORAL | Status: AC

## 2015-01-24 MED ORDER — NAPROXEN 500 MG PO TABS: 500.0000 mg | ORAL_TABLET | Freq: Two times a day (BID) | ORAL | Status: AC

## 2015-01-24 NOTE — ED Notes (Signed)
Had a headache for last 5 days 9/10.  Both ears with shooting pain 8/10.  C/o shoulder pain, rates pain 9/10.  Sensitive to light and sound.  Not able to eat or drink.

## 2015-01-24 NOTE — Discharge Instructions (Signed)

## 2015-01-24 NOTE — ED Provider Notes (Signed)
CSN: [id number]     Arrival date & time 01/24/15  1745 History   First MD Initiated Contact with Patient 01/24/15 1821     Chief Complaint  Patient presents with  . Headache    X 5 days  . Shoulder Pain  . 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 This is a shooting pain that she feels in both of her ears, it is severe, shooting, seems to be worse with position, associated with sensitivity to bright lights and sounds. She has been able to eat and drink though not as much. She denies fevers or stiff neck, she has no chest pain or shortness of breath. She states that the pain radiates from the top of her head down her neck and back. She has been able to ambulate without difficulty. She has not had any medications prior to arrival.  Patient is a 29 y.o. female presenting with headaches, shoulder pain, and neck pain. The history is provided by the patient.  Headache Associated symptoms: neck pain   Shoulder Pain Associated symptoms: neck pain   Neck Pain Associated symptoms: headaches     Past Medical History  Diagnosis Date  . Klippel-Feil syndrome     Cervical spine fusion; scapula abnormality  . Solitary kidney, congenital   . Preterm labor   . NSTEMI at [redacted] weeks gestation affecting pregnancy 04/06/2014    [facility]. Follow by Dr.McLeod in [place]; [facility]    Past Surgical History  Procedure Laterality Date  . No past surgeries     Family History  Problem Relation Age of Onset  . CAD Mother 4250   History  Substance Use Topics  . Smoking status: Former Smoker  -- 0.25 packs/day    Start date: 01/11/1999    Quit date: 03/09/2013  . Smokeless tobacco: Not on file  . Alcohol Use: No   OB History    Gravida Para Term Preterm AB TAB SAB Ectopic Multiple Living   5 3 0 3 1 0 0 0 0 2      Review of Systems  Musculoskeletal: Positive for neck pain.  Neurological: Positive for headaches.  All other systems reviewed and are negative.     Allergies  Review of patient's allergies indicates no known allergies.  Home Medications   Prior to Admission medications   Medication Sig Start Date End Date Taking? Authorizing Provider  amoxicillin-clavulanate (AUGMENTIN) 875-125 MG per tablet Take 1 tablet by mouth every 12 (twelve) hours. 01/24/15   Eber HongBrian Nelsy Madonna, MD  aspirin EC 81 MG tablet Take 1 tablet (81 mg total) by mouth daily. 04/14/14   Corky CraftsJayadeep S Varanasi, MD  ciprofloxacin-dexamethasone [facility](CIPRODEX) otic suspension Place 4 drops into both ears 2 (two) times daily. 01/24/15   Eber HongBrian Kenderick Kobler, MD  HYDROcodone-acetaminophen (NORCO/VICODIN) 5-325 MG per tablet Take 2 tablets by mouth every 6 (six) hours as needed. 12/18/14   Shon Batonourtney F Horton, MD  metoprolol tartrate (LOPRESSOR) 25 MG tablet Take 1 tablet (25 mg total) by mouth 2 (two) times daily. 04/14/14   Corky CraftsJayadeep S Varanasi, MD  naproxen (NAPROSYN) 500 MG tablet Take 1 tablet (  500 mg total) by mouth 2 (two) times daily with a meal. 01/24/15   Eber Hong, MD  ondansetron (ZOFRAN ODT) 4 MG disintegrating tablet Take 1 tablet (4 mg total) by mouth every 8 (eight) hours as needed for nausea or vomiting. 12/18/14   [name], MD   BP 101/55 mmHg  Pulse 99  Temp(Src) 98.5 F (36.9 C) (Oral)  Resp 18  Ht 5' (1.524 m)  Wt 180 lb (81.647 kg)  BMI 35.15 kg/m2  SpO2 100%  LMP 12/03/2013 Physical Exam  Constitutional: She appears well-developed and well-nourished. No distress.  HENT:  Head: Normocephalic and atraumatic.  Mouth/Throat: Oropharynx is clear and moist. No oropharyngeal exudate.   Bilateral tympanic membranes are bulging purulent and there is loss of the light reflex and loss of landmarks. There is tenderness with manipulation of the bilateral auricles and tragus, there is foul-smelling discharge in the left ear external auditory canal  Eyes: Conjunctivae and EOM are normal. Pupils are equal, round, and reactive to light. Right eye exhibits no discharge. Left eye exhibits no discharge. No scleral icterus.  Neck: Normal range of motion. Neck supple. No JVD present. No thyromegaly present.  Very supple neck, no lymphadenopathy  Cardiovascular: Normal rate, regular rhythm, normal heart sounds and intact distal pulses.  Exam reveals no gallop and no friction rub.   No murmur heard. Pulmonary/Chest: Effort normal and breath sounds normal. No respiratory distress. She has no wheezes. She has no rales.  Abdominal: Soft. Bowel sounds are normal. She exhibits no distension and no mass. There is no tenderness.  Musculoskeletal: Normal range of motion. She exhibits no edema or tenderness.  Lymphadenopathy:    She has no cervical adenopathy.  Neurological: She is alert. Coordination normal.  Normal gait and speech, normal coordination, normal strength in all 4 extremities  Skin: Skin is warm and dry. No rash noted. No erythema.  Psychiatric: She has a normal mood and affect. Her behavior is normal.  Nursing note and vitals reviewed.   ED Course  Procedures (including critical care time) Labs Review Labs Reviewed - No data to display  Imaging Review No results found.   EKG Interpretation None      MDM   Final diagnoses:  Nonintractable headache, unspecified chronicity pattern, unspecified headache type  Bilateral acute otitis media, recurrence not specified, unspecified otitis media type    The patient has bilateral otitis media, her vital signs are normal, she is taking her daily aspirin as prescribed by her doctor, she has not having any chest pain or difficulty  breathing, no exertional symptoms. She has bilateral otitis which is likely the source of her headaches as well. Otitis externa is likely given the drainage and discharge in her left ear, she will be given both drops and oral antibiotics.  Meds given in ED:  Medications  ketorolac (TORADOL) injection 60 mg (not administered)    New Prescriptions   AMOXICILLIN-CLAVULANATE (AUGMENTIN) 875-125 MG PER TABLET    Take 1 tablet by mouth every 12 (twelve) hours.   CIPROFLOXACIN-DEXAMETHASONE (CIPRODEX) OTIC SUSPENSION    Place 4 drops into both ears 2 (two) times daily.   NAPROXEN (NAPROSYN) 500 MG TABLET    Take 1 tablet (500 mg total) by mouth 2 (two) times daily with a meal.      Eber Hong, MD 01/24/15 [phone]

## 2016-07-13 IMAGING — CT CT ABD-PELV W/ CM
2 of 4 series · 16 of 46 positions shown, 18 images · IV contrast (Omnipaque 300)
Comparison: None.

CLINICAL DATA: Upper abdominal pain beginning at 4 p.m. congenital
solitary kidney.

EXAM:
CT ABDOMEN AND PELVIS WITH CONTRAST
TECHNIQUE: Multidetector CT imaging of the abdomen and pelvis was performed
using the standard protocol following bolus administration of
intravenous contrast.
CONTRAST:  25mL OMNIPAQUE IOHEXOL 300 MG/ML SOLN, 100mL OMNIPAQUE
IOHEXOL 300 MG/ML SOLN

[Series 2: abd_pel_with 5.0 b40f · axial · 0.78mm/px · z∈[-427,-67]mm · 13 of 80 slices shown, 15 images]
[im 4/80  soft-tissue]
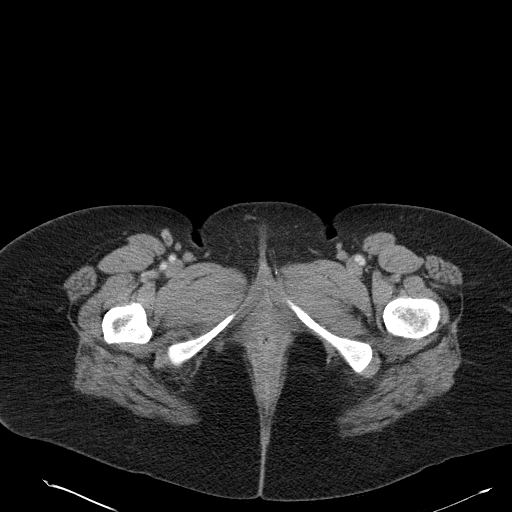
[im 4/80  bone]
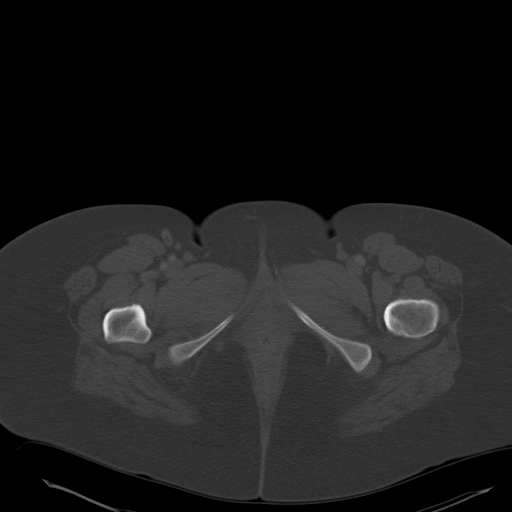
[im 10/80  soft-tissue]
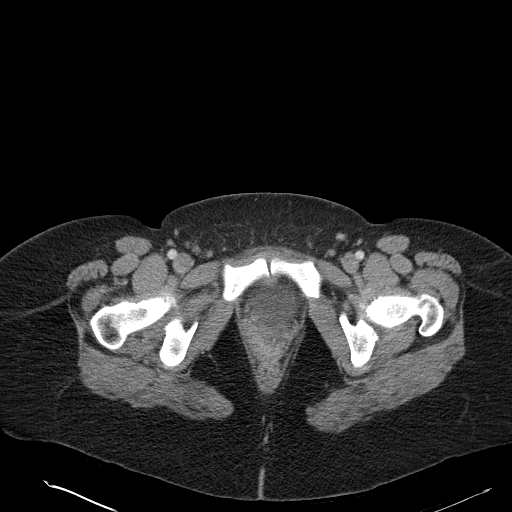
[im 16/80  soft-tissue]
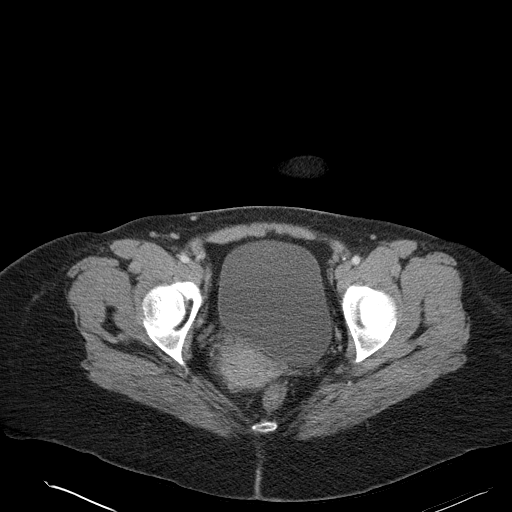
[im 22/80  soft-tissue]
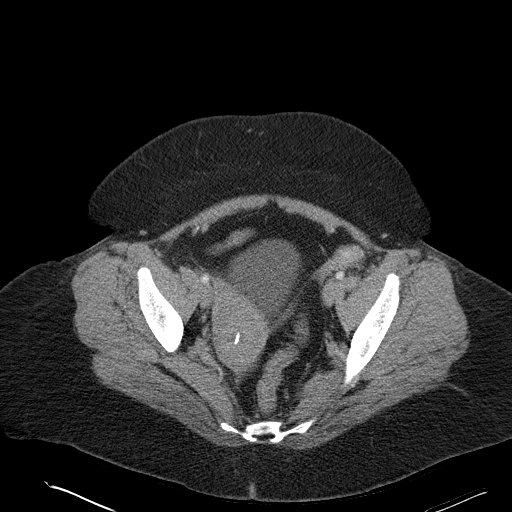
[im 28/80  soft-tissue]
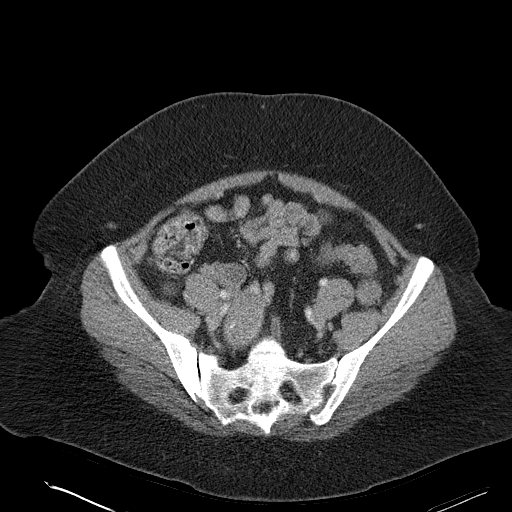
[im 34/80  soft-tissue]
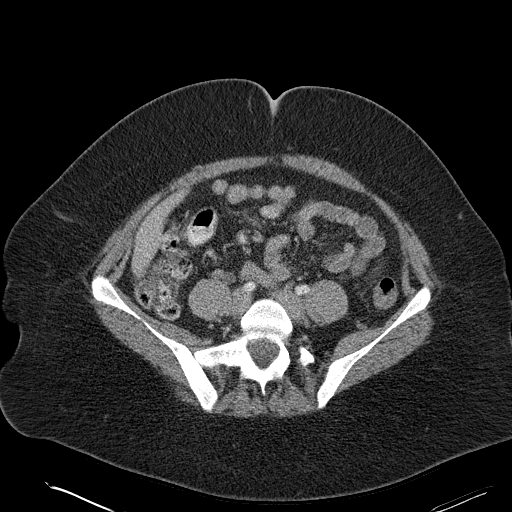
[im 40/80  soft-tissue]
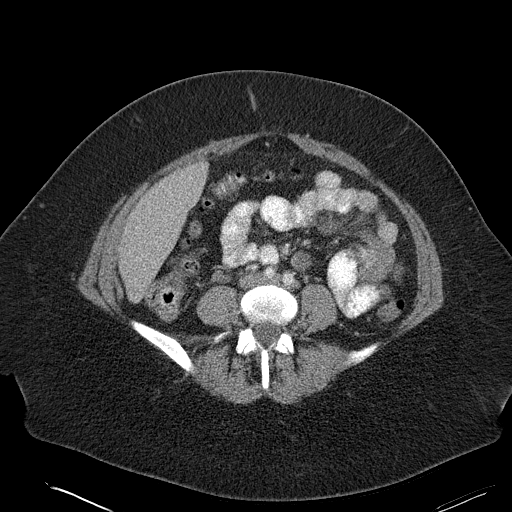
[im 46/80  soft-tissue]
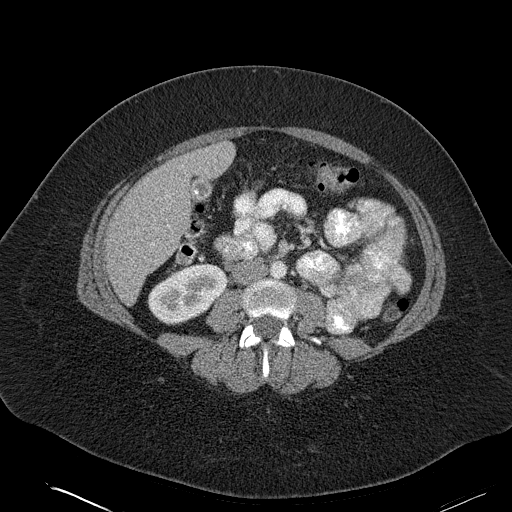
[im 52/80  soft-tissue]
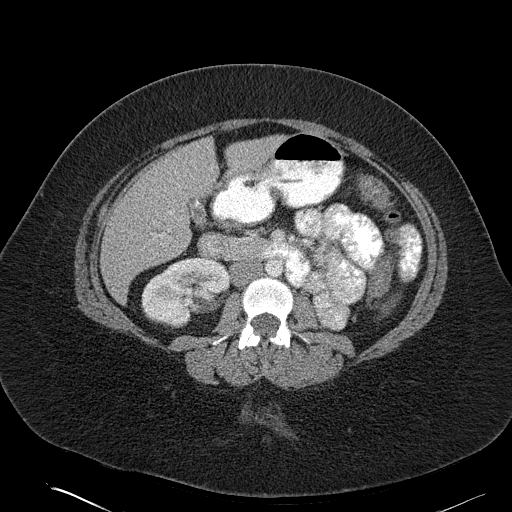
[im 52/80  bone]
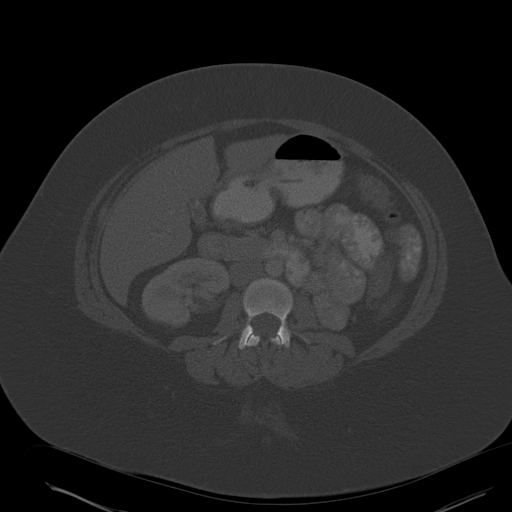
[im 58/80  soft-tissue]
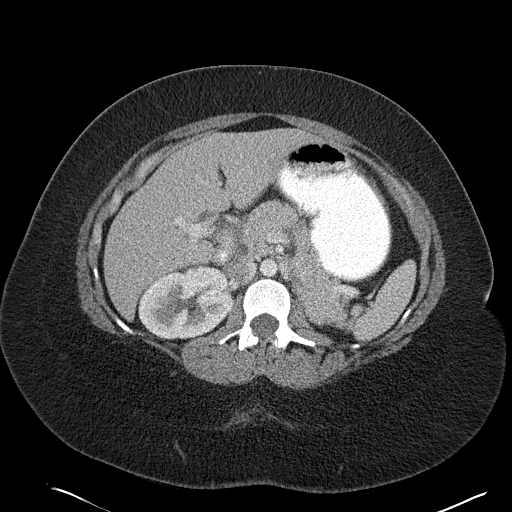
[im 64/80  soft-tissue]
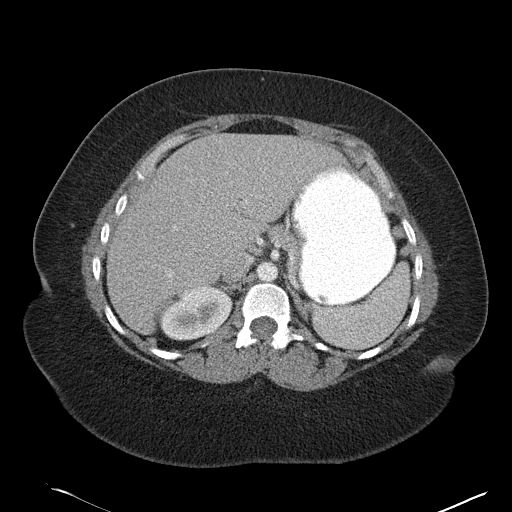
[im 70/80  soft-tissue]
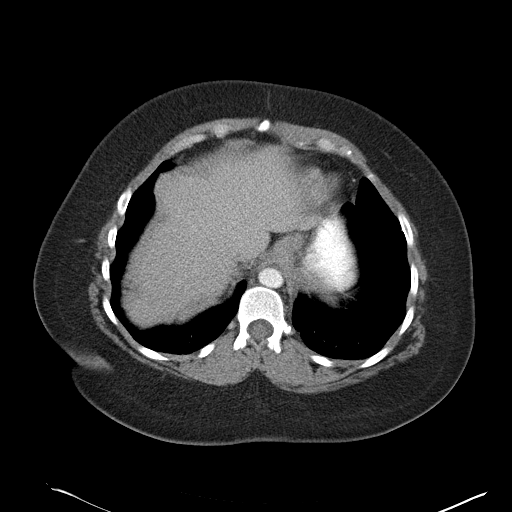
[im 76/80  soft-tissue]
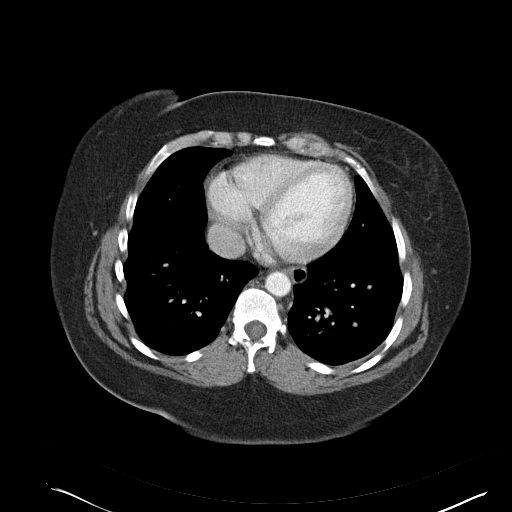

[Series 3: abd_pel_with 3.0 spo cor · coronal · 0.72mm/px · 3 of 92 slices shown]
[im 31/92  soft-tissue]
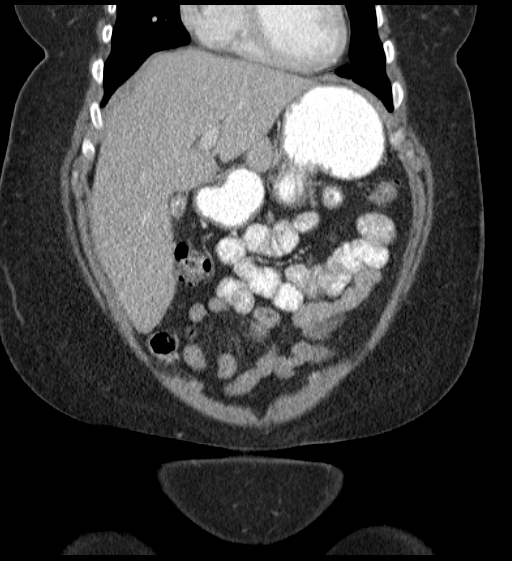
[im 41/92  soft-tissue]
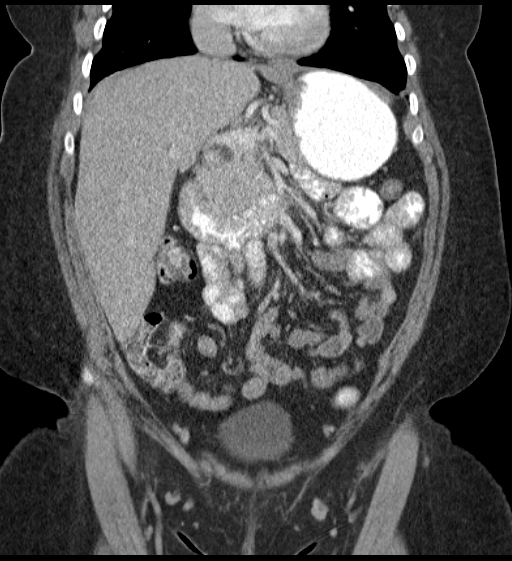
[im 51/92  soft-tissue]
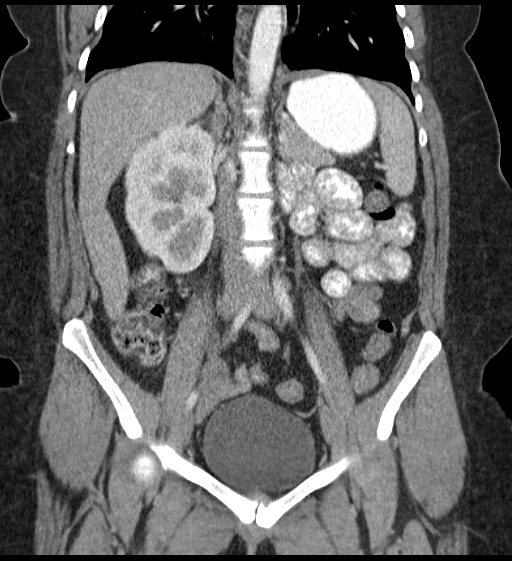

[16 of 46 positions shown; findings below may reference images not displayed]

FINDINGS: LUNG BASES: Included view of the lung bases are clear. Visualized
heart and pericardium are unremarkable.

SOLID ORGANS: The liver, is mildly enlarged and otherwise
unremarkable. Spleen, pancreas and adrenal glands are unremarkable.
Multiple subcentimeter gallstones, without CT findings of acute
cholecystitis.

GASTROINTESTINAL TRACT: The stomach, small and large bowel are
normal in course and caliber without inflammatory changes. Enteric
contrast has not yet reached the distal small bowel. The appendix is
not discretely identified, however there are no inflammatory changes
in the right lower quadrant.

KIDNEYS/ URINARY TRACT: RIGHT kidney is orthotopic, demonstrating
normal enhancement, compensatory nephro megaly. Absent LEFT kidney
consistent with history 3.5 mm nonobstructing RIGHT upper pole
nephrolithiasis. No hydronephrosis or solid renal masses. Urinary
bladder is partially distended and unremarkable.

PERITONEUM/RETROPERITONEUM: Aortoiliac vessels are normal in course
and caliber. No lymphadenopathy by CT size criteria. IUD within the
central uterus. No intraperitoneal free fluid nor free air.

SOFT TISSUE/OSSEOUS STRUCTURES: Non-suspicious. Subcentimeter LEFT
femoral head probable bone island. Moderate to severe pubic
symphyseal osteoarthrosis. Severe sclerosis about the RIGHT
sacroiliac joint can be seen with osteitis ileitis condensans or
possible sacroiliitis.
IMPRESSION: No acute intra-abdominal or pelvic process.

Solitary RIGHT kidney with non obstructing 3.5 mm RIGHT upper pole
nephrolithiasis.

Mild hepatomegaly. Cholelithiasis without CT findings of acute
cholecystitis.
